# Patient Record
Sex: Male | Born: 1940 | Race: White | Hispanic: No | Marital: Married | Smoking: Never smoker
Health system: Southern US, Community
[De-identification: ages and names within clinical notes are randomized; demographics above are authoritative.]

## PROBLEM LIST (undated history)

## (undated) DIAGNOSIS — R1310 Dysphagia, unspecified: Secondary | ICD-10-CM

## (undated) DIAGNOSIS — C61 Malignant neoplasm of prostate: Secondary | ICD-10-CM

## (undated) DIAGNOSIS — H02409 Unspecified ptosis of unspecified eyelid: Secondary | ICD-10-CM

## (undated) DIAGNOSIS — N529 Male erectile dysfunction, unspecified: Secondary | ICD-10-CM

## (undated) DIAGNOSIS — H269 Unspecified cataract: Secondary | ICD-10-CM

## (undated) DIAGNOSIS — F32A Depression, unspecified: Secondary | ICD-10-CM

## (undated) DIAGNOSIS — K649 Unspecified hemorrhoids: Secondary | ICD-10-CM

## (undated) DIAGNOSIS — I2510 Atherosclerotic heart disease of native coronary artery without angina pectoris: Secondary | ICD-10-CM

## (undated) DIAGNOSIS — R32 Unspecified urinary incontinence: Secondary | ICD-10-CM

## (undated) DIAGNOSIS — I10 Essential (primary) hypertension: Secondary | ICD-10-CM

## (undated) DIAGNOSIS — F988 Other specified behavioral and emotional disorders with onset usually occurring in childhood and adolescence: Secondary | ICD-10-CM

## (undated) DIAGNOSIS — I639 Cerebral infarction, unspecified: Secondary | ICD-10-CM

## (undated) DIAGNOSIS — IMO0002 Reserved for concepts with insufficient information to code with codable children: Secondary | ICD-10-CM

## (undated) DIAGNOSIS — K219 Gastro-esophageal reflux disease without esophagitis: Secondary | ICD-10-CM

## (undated) DIAGNOSIS — M25473 Effusion, unspecified ankle: Secondary | ICD-10-CM

## (undated) DIAGNOSIS — F419 Anxiety disorder, unspecified: Secondary | ICD-10-CM

## (undated) DIAGNOSIS — N189 Chronic kidney disease, unspecified: Secondary | ICD-10-CM

## (undated) DIAGNOSIS — T7840XA Allergy, unspecified, initial encounter: Secondary | ICD-10-CM

## (undated) DIAGNOSIS — M1990 Unspecified osteoarthritis, unspecified site: Secondary | ICD-10-CM

## (undated) DIAGNOSIS — K5790 Diverticulosis of intestine, part unspecified, without perforation or abscess without bleeding: Secondary | ICD-10-CM

## (undated) DIAGNOSIS — Z87898 Personal history of other specified conditions: Secondary | ICD-10-CM

## (undated) DIAGNOSIS — D175 Benign lipomatous neoplasm of intra-abdominal organs: Secondary | ICD-10-CM

## (undated) DIAGNOSIS — E785 Hyperlipidemia, unspecified: Secondary | ICD-10-CM

## (undated) HISTORY — DX: Other specified behavioral and emotional disorders with onset usually occurring in childhood and adolescence: F98.8

## (undated) HISTORY — DX: Unspecified osteoarthritis, unspecified site: M19.90

## (undated) HISTORY — DX: Dysphagia, unspecified: R13.10

## (undated) HISTORY — DX: Benign lipomatous neoplasm of intra-abdominal organs: D17.5

## (undated) HISTORY — DX: Allergy, unspecified, initial encounter: T78.40XA

## (undated) HISTORY — PX: EYE SURGERY: SHX253

## (undated) HISTORY — PX: PROSTATE SURGERY: SHX751

## (undated) HISTORY — DX: Diverticulosis of intestine, part unspecified, without perforation or abscess without bleeding: K57.90

## (undated) HISTORY — DX: Malignant neoplasm of prostate: C61

## (undated) HISTORY — DX: Unspecified cataract: H26.9

## (undated) HISTORY — PX: COLONOSCOPY: SHX174

## (undated) HISTORY — DX: Unspecified hemorrhoids: K64.9

## (undated) HISTORY — PX: OTHER SURGICAL HISTORY: SHX169

## (undated) HISTORY — DX: Reserved for concepts with insufficient information to code with codable children: IMO0002

## (undated) HISTORY — PX: MENISCUS REPAIR: SHX5179

## (undated) HISTORY — DX: Cerebral infarction, unspecified: I63.9

## (undated) HISTORY — DX: Essential (primary) hypertension: I10

## (undated) HISTORY — DX: Hyperlipidemia, unspecified: E78.5

---

## 1998-04-22 ENCOUNTER — Inpatient Hospital Stay (HOSPITAL_COMMUNITY): Admission: EM | Admit: 1998-04-22 | Discharge: 1998-04-23 | Payer: Self-pay | Admitting: Emergency Medicine

## 1998-04-22 ENCOUNTER — Encounter: Payer: Self-pay | Admitting: Emergency Medicine

## 1998-04-23 ENCOUNTER — Encounter: Payer: Self-pay | Admitting: Emergency Medicine

## 1998-06-27 HISTORY — PX: OTHER SURGICAL HISTORY: SHX169

## 2000-05-26 ENCOUNTER — Other Ambulatory Visit: Admission: RE | Admit: 2000-05-26 | Discharge: 2000-05-26 | Payer: Self-pay | Admitting: Gastroenterology

## 2000-05-26 ENCOUNTER — Encounter (INDEPENDENT_AMBULATORY_CARE_PROVIDER_SITE_OTHER): Payer: Self-pay | Admitting: Specialist

## 2000-11-21 ENCOUNTER — Encounter (INDEPENDENT_AMBULATORY_CARE_PROVIDER_SITE_OTHER): Payer: Self-pay | Admitting: *Deleted

## 2000-11-21 ENCOUNTER — Ambulatory Visit (HOSPITAL_BASED_OUTPATIENT_CLINIC_OR_DEPARTMENT_OTHER): Admission: RE | Admit: 2000-11-21 | Discharge: 2000-11-21 | Payer: Self-pay | Admitting: Plastic Surgery

## 2001-11-01 ENCOUNTER — Encounter (INDEPENDENT_AMBULATORY_CARE_PROVIDER_SITE_OTHER): Payer: Self-pay | Admitting: Specialist

## 2001-11-01 ENCOUNTER — Ambulatory Visit (HOSPITAL_BASED_OUTPATIENT_CLINIC_OR_DEPARTMENT_OTHER): Admission: RE | Admit: 2001-11-01 | Discharge: 2001-11-01 | Payer: Self-pay | Admitting: Orthopedic Surgery

## 2004-06-02 ENCOUNTER — Ambulatory Visit: Payer: Self-pay | Admitting: Internal Medicine

## 2004-07-13 ENCOUNTER — Ambulatory Visit: Payer: Self-pay | Admitting: Internal Medicine

## 2004-07-23 ENCOUNTER — Ambulatory Visit: Payer: Self-pay | Admitting: Internal Medicine

## 2004-08-03 ENCOUNTER — Ambulatory Visit: Payer: Self-pay

## 2005-01-13 ENCOUNTER — Ambulatory Visit: Payer: Self-pay | Admitting: Internal Medicine

## 2005-02-07 ENCOUNTER — Ambulatory Visit: Payer: Self-pay | Admitting: Internal Medicine

## 2005-04-18 ENCOUNTER — Ambulatory Visit: Payer: Self-pay | Admitting: Internal Medicine

## 2005-05-11 ENCOUNTER — Ambulatory Visit: Payer: Self-pay | Admitting: Internal Medicine

## 2006-04-10 ENCOUNTER — Ambulatory Visit: Payer: Self-pay | Admitting: Internal Medicine

## 2006-05-03 ENCOUNTER — Ambulatory Visit: Payer: Self-pay | Admitting: Gastroenterology

## 2006-05-05 ENCOUNTER — Ambulatory Visit: Payer: Self-pay | Admitting: Internal Medicine

## 2006-05-05 LAB — CONVERTED CEMR LAB
AST: 21 units/L (ref 0–37)
Total CK: 152 units/L (ref 7–195)

## 2006-06-15 ENCOUNTER — Ambulatory Visit: Payer: Self-pay | Admitting: Internal Medicine

## 2006-06-15 LAB — CONVERTED CEMR LAB
AST: 27 units/L (ref 0–37)
Chol/HDL Ratio, serum: 5
Cholesterol: 222 mg/dL (ref 0–200)
LDL DIRECT: 146.5 mg/dL
Triglyceride fasting, serum: 147 mg/dL (ref 0–149)

## 2006-07-04 ENCOUNTER — Encounter (INDEPENDENT_AMBULATORY_CARE_PROVIDER_SITE_OTHER): Payer: Self-pay | Admitting: Specialist

## 2006-07-04 ENCOUNTER — Ambulatory Visit: Payer: Self-pay | Admitting: Gastroenterology

## 2006-07-21 DIAGNOSIS — Z860100 Personal history of colon polyps, unspecified: Secondary | ICD-10-CM | POA: Insufficient documentation

## 2006-07-31 ENCOUNTER — Ambulatory Visit: Payer: Self-pay | Admitting: Internal Medicine

## 2006-10-06 ENCOUNTER — Ambulatory Visit: Payer: Self-pay | Admitting: Family Medicine

## 2007-04-20 ENCOUNTER — Telehealth (INDEPENDENT_AMBULATORY_CARE_PROVIDER_SITE_OTHER): Payer: Self-pay | Admitting: *Deleted

## 2007-05-18 ENCOUNTER — Telehealth (INDEPENDENT_AMBULATORY_CARE_PROVIDER_SITE_OTHER): Payer: Self-pay | Admitting: *Deleted

## 2007-05-21 ENCOUNTER — Telehealth (INDEPENDENT_AMBULATORY_CARE_PROVIDER_SITE_OTHER): Payer: Self-pay | Admitting: *Deleted

## 2007-05-31 ENCOUNTER — Ambulatory Visit: Payer: Self-pay | Admitting: Internal Medicine

## 2007-05-31 DIAGNOSIS — T887XXA Unspecified adverse effect of drug or medicament, initial encounter: Secondary | ICD-10-CM | POA: Insufficient documentation

## 2007-05-31 DIAGNOSIS — E782 Mixed hyperlipidemia: Secondary | ICD-10-CM

## 2007-05-31 DIAGNOSIS — N400 Enlarged prostate without lower urinary tract symptoms: Secondary | ICD-10-CM | POA: Insufficient documentation

## 2007-05-31 DIAGNOSIS — E8881 Metabolic syndrome: Secondary | ICD-10-CM

## 2007-05-31 LAB — CONVERTED CEMR LAB
HDL goal, serum: 40 mg/dL
LDL Goal: 100 mg/dL

## 2007-06-03 ENCOUNTER — Encounter: Payer: Self-pay | Admitting: Internal Medicine

## 2007-06-03 LAB — CONVERTED CEMR LAB
ALT: 35 units/L (ref 0–53)
AST: 28 units/L (ref 0–37)
Alkaline Phosphatase: 66 units/L (ref 39–117)
Cholesterol: 165 mg/dL (ref 0–200)
Creatinine, Ser: 1.1 mg/dL (ref 0.4–1.5)
Folate: 17.1 ng/mL
PSA: 5.52 ng/mL — ABNORMAL HIGH (ref 0.10–4.00)
Potassium: 4.7 meq/L (ref 3.5–5.1)
TSH: 0.88 microintl units/mL (ref 0.35–5.50)
Triglycerides: 50 mg/dL (ref 0–149)
Vitamin B-12: 236 pg/mL (ref 211–911)

## 2007-06-04 ENCOUNTER — Encounter (INDEPENDENT_AMBULATORY_CARE_PROVIDER_SITE_OTHER): Payer: Self-pay | Admitting: *Deleted

## 2007-06-25 ENCOUNTER — Encounter: Payer: Self-pay | Admitting: Internal Medicine

## 2007-06-28 DIAGNOSIS — C61 Malignant neoplasm of prostate: Secondary | ICD-10-CM

## 2007-06-28 HISTORY — DX: Malignant neoplasm of prostate: C61

## 2007-08-20 ENCOUNTER — Ambulatory Visit: Payer: Self-pay | Admitting: Internal Medicine

## 2007-11-15 ENCOUNTER — Encounter: Payer: Self-pay | Admitting: Internal Medicine

## 2007-12-01 ENCOUNTER — Ambulatory Visit: Admission: EM | Admit: 2007-12-01 | Discharge: 2007-12-01 | Payer: Self-pay | Admitting: Orthopedic Surgery

## 2008-01-25 ENCOUNTER — Encounter: Payer: Self-pay | Admitting: Internal Medicine

## 2008-02-22 ENCOUNTER — Encounter: Payer: Self-pay | Admitting: Internal Medicine

## 2008-06-23 ENCOUNTER — Encounter: Admission: RE | Admit: 2008-06-23 | Discharge: 2008-06-23 | Payer: Self-pay

## 2008-06-27 HISTORY — PX: EYE SURGERY: SHX253

## 2008-07-29 ENCOUNTER — Telehealth (INDEPENDENT_AMBULATORY_CARE_PROVIDER_SITE_OTHER): Payer: Self-pay | Admitting: *Deleted

## 2008-08-27 ENCOUNTER — Encounter: Payer: Self-pay | Admitting: Internal Medicine

## 2008-09-12 ENCOUNTER — Telehealth (INDEPENDENT_AMBULATORY_CARE_PROVIDER_SITE_OTHER): Payer: Self-pay | Admitting: *Deleted

## 2008-09-16 ENCOUNTER — Ambulatory Visit: Payer: Self-pay | Admitting: Internal Medicine

## 2008-09-16 DIAGNOSIS — Z85828 Personal history of other malignant neoplasm of skin: Secondary | ICD-10-CM

## 2008-09-16 DIAGNOSIS — K219 Gastro-esophageal reflux disease without esophagitis: Secondary | ICD-10-CM | POA: Insufficient documentation

## 2008-09-16 DIAGNOSIS — Z8546 Personal history of malignant neoplasm of prostate: Secondary | ICD-10-CM

## 2008-09-16 DIAGNOSIS — K5730 Diverticulosis of large intestine without perforation or abscess without bleeding: Secondary | ICD-10-CM | POA: Insufficient documentation

## 2008-09-16 LAB — CONVERTED CEMR LAB
Cholesterol, target level: 200 mg/dL
LDL Goal: 100 mg/dL

## 2008-09-17 ENCOUNTER — Encounter: Payer: Self-pay | Admitting: Internal Medicine

## 2008-10-22 ENCOUNTER — Ambulatory Visit: Payer: Self-pay | Admitting: Internal Medicine

## 2008-10-25 LAB — CONVERTED CEMR LAB
ALT: 23 units/L (ref 0–53)
AST: 23 units/L (ref 0–37)
Alkaline Phosphatase: 74 units/L (ref 39–117)
Bilirubin, Direct: 0 mg/dL (ref 0.0–0.3)
Direct LDL: 129.2 mg/dL
HDL: 44 mg/dL (ref >39.00)
Hgb A1c MFr Bld: 5.4 % (ref 4.6–6.5)
TSH: 1.03 microintl units/mL (ref 0.35–5.50)
Total Protein: 7.3 g/dL (ref 6.0–8.3)

## 2008-10-28 ENCOUNTER — Encounter (INDEPENDENT_AMBULATORY_CARE_PROVIDER_SITE_OTHER): Payer: Self-pay | Admitting: *Deleted

## 2009-02-11 ENCOUNTER — Ambulatory Visit: Payer: Self-pay | Admitting: Internal Medicine

## 2009-02-11 DIAGNOSIS — M25559 Pain in unspecified hip: Secondary | ICD-10-CM | POA: Insufficient documentation

## 2009-02-11 DIAGNOSIS — M25569 Pain in unspecified knee: Secondary | ICD-10-CM | POA: Insufficient documentation

## 2009-02-14 LAB — CONVERTED CEMR LAB
Creatinine, Ser: 1 mg/dL (ref 0.4–1.5)
Potassium: 4 meq/L (ref 3.5–5.1)

## 2009-02-16 ENCOUNTER — Encounter (INDEPENDENT_AMBULATORY_CARE_PROVIDER_SITE_OTHER): Payer: Self-pay | Admitting: *Deleted

## 2009-02-18 ENCOUNTER — Telehealth (INDEPENDENT_AMBULATORY_CARE_PROVIDER_SITE_OTHER): Payer: Self-pay | Admitting: *Deleted

## 2009-03-04 ENCOUNTER — Encounter: Payer: Self-pay | Admitting: Internal Medicine

## 2009-03-23 ENCOUNTER — Telehealth (INDEPENDENT_AMBULATORY_CARE_PROVIDER_SITE_OTHER): Payer: Self-pay | Admitting: *Deleted

## 2009-05-04 ENCOUNTER — Telehealth (INDEPENDENT_AMBULATORY_CARE_PROVIDER_SITE_OTHER): Payer: Self-pay | Admitting: *Deleted

## 2009-08-10 ENCOUNTER — Telehealth (INDEPENDENT_AMBULATORY_CARE_PROVIDER_SITE_OTHER): Payer: Self-pay | Admitting: *Deleted

## 2009-10-02 ENCOUNTER — Telehealth (INDEPENDENT_AMBULATORY_CARE_PROVIDER_SITE_OTHER): Payer: Self-pay | Admitting: *Deleted

## 2009-10-12 ENCOUNTER — Telehealth (INDEPENDENT_AMBULATORY_CARE_PROVIDER_SITE_OTHER): Payer: Self-pay | Admitting: *Deleted

## 2009-12-04 ENCOUNTER — Ambulatory Visit: Payer: Self-pay | Admitting: Internal Medicine

## 2009-12-07 LAB — CONVERTED CEMR LAB: Potassium: 4.3 meq/L (ref 3.5–5.1)

## 2010-01-01 ENCOUNTER — Ambulatory Visit: Payer: Self-pay | Admitting: Internal Medicine

## 2010-01-01 DIAGNOSIS — R0789 Other chest pain: Secondary | ICD-10-CM | POA: Insufficient documentation

## 2010-01-01 DIAGNOSIS — R5381 Other malaise: Secondary | ICD-10-CM

## 2010-01-01 DIAGNOSIS — R5383 Other fatigue: Secondary | ICD-10-CM

## 2010-01-04 ENCOUNTER — Ambulatory Visit: Payer: Self-pay | Admitting: Internal Medicine

## 2010-02-12 ENCOUNTER — Telehealth: Payer: Self-pay | Admitting: Internal Medicine

## 2010-02-23 ENCOUNTER — Ambulatory Visit: Payer: Self-pay | Admitting: Internal Medicine

## 2010-05-17 ENCOUNTER — Telehealth (INDEPENDENT_AMBULATORY_CARE_PROVIDER_SITE_OTHER): Payer: Self-pay | Admitting: *Deleted

## 2010-07-25 LAB — CONVERTED CEMR LAB
ALT: 29 units/L (ref 0–53)
BUN: 19 mg/dL (ref 6–23)
Basophils Relative: 0.5 % (ref 0.0–3.0)
CO2: 29 meq/L (ref 19–32)
Chloride: 105 meq/L (ref 96–112)
Eosinophils Relative: 1 % (ref 0.0–5.0)
HCT: 49.7 % (ref 39.0–52.0)
Lymphs Abs: 2.7 10*3/uL (ref 0.7–4.0)
MCHC: 35 g/dL (ref 30.0–36.0)
MCV: 95 fL (ref 78.0–100.0)
Monocytes Absolute: 0.9 10*3/uL (ref 0.1–1.0)
Platelets: 252 10*3/uL (ref 150.0–400.0)
Potassium: 4.8 meq/L (ref 3.5–5.1)
TSH: 0.96 microintl units/mL (ref 0.35–5.50)
Total Bilirubin: 0.8 mg/dL (ref 0.3–1.2)
Total Protein: 7.7 g/dL (ref 6.0–8.3)
WBC: 8.6 10*3/uL (ref 4.5–10.5)

## 2010-07-29 NOTE — Assessment & Plan Note (Signed)
Summary: ELEVATED BLOOD PRESS///SPH/rescd cbs   Vital Signs:  Patient profile:   70 year old male Weight:      222.0 pounds BMI:     33.38 Temp:     98.2 degrees F oral Pulse rate:   72 / minute Resp:     14 per minute BP sitting:   138 / 84  (left arm) Cuff size:   large  Vitals Entered By: Shonna Chock (December 04, 2009 9:30 AM) CC: Blood Pressure concerns, Hypertension Management Comments REVIEWED MED LIST, PATIENT AGREED DOSE AND INSTRUCTION CORRECT    CC:  Blood Pressure concerns and Hypertension Management.  History of Present Illness: BP ranges 128/69- 165/100 ; some stress triggers for HTN. HCTZ not taken; he is not sure why.  Hypertension History:      He complains of visual symptoms, but denies headache, chest pain, palpitations, dyspnea with exertion, orthopnea, PND, peripheral edema, neurologic problems, syncope, and side effects from treatment.        Positive major cardiovascular risk factors include male age 84 years old or older, hyperlipidemia, and hypertension.  Negative major cardiovascular risk factors include no history of diabetes, negative family history for ischemic heart disease, and non-tobacco-user status.        Further assessment for target organ damage reveals no history of ASHD, stroke/TIA, or peripheral vascular disease.     Allergies (verified): No Known Drug Allergies  Review of Systems Eyes:  Complains of blurring; denies double vision and vision loss-both eyes. CV:  Denies leg cramps with exertion, lightheadness, and near fainting. Neuro:  Denies brief paralysis, numbness, tingling, and weakness.  Physical Exam  General:  in no acute distress; alert,appropriate and cooperative throughout examination Lungs:  Normal respiratory effort, chest expands symmetrically. Lungs are clear to auscultation, no crackles or wheezes. Heart:  Normal rate and regular rhythm. S1 and S2 normal without gallop, murmur, click, rub . S4 with slurring Abdomen:   Bowel sounds positive,abdomen soft and non-tender without masses, organomegaly or hernias noted. No AAA or bruits Pulses:  R and L carotid,radial,dorsalis pedis and posterior tibial pulses are full and equal bilaterally Extremities:  No clubbing, cyanosis, edema. Psych:  memory intact for recent and remote and subdued.     Impression & Recommendations:  Problem # 1:  HYPERTENSION, ESSENTIAL NOS (ICD-401.9)  The following medications were removed from the medication list:    Hydrochlorothiazide 25 Mg Tabs (Hydrochlorothiazide) .Marland Kitchen... 1/2 tab qd His updated medication list for this problem includes:    Benazepril Hcl 40 Mg Tabs (Benazepril hcl) .Marland Kitchen... 1 by mouth qd    Amlodipine Besylate 5 Mg Tabs (Amlodipine besylate) .Marland Kitchen... 1 once daily    Hydrochlorothiazide 12.5 Mg Caps (Hydrochlorothiazide) .Marland Kitchen... 1 once daily  Orders: TLB-BUN (Urea Nitrogen) (84520-BUN)  Complete Medication List: 1)  Viagra 100 Mg Tabs (Sildenafil citrate) .... As needed 2)  Benazepril Hcl 40 Mg Tabs (Benazepril hcl) .Marland Kitchen.. 1 by mouth qd 3)  Amlodipine Besylate 5 Mg Tabs (Amlodipine besylate) .Marland Kitchen.. 1 once daily 4)  Hydrochlorothiazide 12.5 Mg Caps (Hydrochlorothiazide) .Marland Kitchen.. 1 once daily  Other Orders: Prescription Created Electronically [id number]) Venipuncture (60454) TLB-Creatinine, Blood (82565-CREA) TLB-Potassium (K+) (84132-K)  Hypertension Assessment/Plan:      The patient's hypertensive risk group is category B: At least one risk factor (excluding diabetes) with no target organ damage.  His calculated 10 year risk of coronary heart disease is 11 %.  Today's blood pressure is 138/84.    Patient Instructions: 1)  Limit your Sodium (  Salt) to less than 4 grams a day (slightly less than 1 teaspoon) to prevent fluid retention, swelling, or worsening or symptoms. Annual [facility] [place] exam indicated. 2)  Check your Blood Pressure regularly. If it is above: 140/90 ON AVERAGE  you should make an appointment.( Note:  [phone]) Prescriptions: HYDROCHLOROTHIAZIDE 12.5 MG CAPS (HYDROCHLOROTHIAZIDE) 1 once daily  #90 x 1   Entered and Authorized by:   Marga Melnick MD   Signed by:   Marga Melnick MD on 12/04/2009   Method used:   Faxed to ...       [facility] (retail)       [address]       [place], [place]  [address]       Ph: [phone]       Fax: [phone]   RxID:   [id number] BENAZEPRIL HCL 40 MG  TABS (BENAZEPRIL HCL) 1 by mouth qd  #90 x 1   Entered and Authorized by:   Marga Melnick MD   Signed by:   Marga Melnick MD on 12/04/2009   Method used:   Faxed to ...       [facility] (retail)       [address]       [place], [place]  [address]       Ph: [phone]       Fax: [phone]   RxID:   [id number] AMLODIPINE BESYLATE 5 MG  TABS (AMLODIPINE BESYLATE) 1 once daily  #90 x 1   Entered and Authorized by:   Marga Melnick MD   Signed by:   Marga Melnick MD on 12/04/2009   Method used:   Faxed to ...       [facility] (retail)       [address]       [place], [place]  [address]       Ph: [phone]       Fax: [phone]   RxID:   [id number]

## 2010-07-29 NOTE — Assessment & Plan Note (Signed)
Summary: LIGHT-HEADED FOR FEW DAYS////SPH   Vital Signs:  Patient profile:   70 year old male Weight:      221.6 pounds Temp:     98.4 degrees F oral Pulse rate:   63 / minute BP sitting:   122 / 80  (left arm) Cuff size:   large  Vitals Entered By: Shonna Chock (January 01, 2010 12:26 PM) CC: Light-Headed and pressure in chest, Syncope Comments REVIEWED MED LIST, PATIENT AGREED DOSE AND INSTRUCTION CORRECT    CC:  Light-Headed and pressure in chest and Syncope.  History of Present Illness:        This is a 70 year old man who presents with lightheadedness intermittently for 2 + weeks, especially in afternoon.  The patient reports occasional vague  chest discomfort ,worse with deep breathing.He denies loss of consciousness, premonitory symptoms,specific triggers such as activity, meal intake  or position change.He also denies  near loss of consciousness, palpitations, shortness of breath, and incontinence beyond that related to prostate issues.  Associated symptoms include feeling warm or flushed occasionally.  The patient denies the following symptoms: headache, abdominal discomfort, nausea, vomiting, pallor, diaphoresis, focal weakness, blurred vision, or  perioral numbness.  The patient reports the following precipitating factors: emotional distress.  Risk factors for syncope include antihypertensive medications.    Allergies (verified): No Known Drug Allergies  Review of Systems General:  Complains of sweats; denies chills and fever; Mild fatigue . Eyes:  Denies double vision; Occasioanl blurred vision OD. ENT:  Denies decreased hearing, difficulty swallowing, and hoarseness. Resp:  Denies cough, coughing up blood, excessive snoring, hypersomnolence, morning headaches, and sputum productive; No apnea. Derm:  Denies changes in nail beds, dryness, and hair loss. Neuro:  Complains of difficulty with concentration; denies numbness, tingling, and weakness. Psych:  Denies anxiety; Some  depression. Endo:  Denies cold intolerance and heat intolerance.  Physical Exam  General:  well-nourished,in no acute distress; alert,appropriate and cooperative throughout examination Eyes:  No corneal or conjunctival inflammation noted. EOMI. Perrla. Field of  Vision grossly normal. Neck:  No deformities, masses, or tenderness noted. Lungs:  Normal respiratory effort, chest expands symmetrically. Lungs are clear to auscultation, no crackles or wheezes. Heart:  regular rhythm, no murmur, no gallop, no rub, no JVD, and bradycardia.  S4 with slurring Pulses:  R and L carotid pulses are full and equal bilaterally w/o bruits Extremities:  No clubbing, cyanosis, edema. Neurologic:   oriented X3, cranial nerves II-XII intact, strength normal in all extremities, sensation intact to light touch, gait normal, DTRs symmetrical and normal, finger-to-nose normal, and Romberg negative.  No pronator drift Skin:  Intact without suspicious lesions or rashes Cervical Nodes:  No lymphadenopathy noted Psych:  memory intact for recent and remote, flat affect, and subdued.     Impression & Recommendations:  Problem # 1:  DIZZINESS (ICD-780.4)  Orders: Venipuncture ([address]) TLB-BMP (Basic Metabolic Panel-BMET) (80048-METABOL) TLB-CBC Platelet - w/Differential (85025-CBCD) TLB-Hepatic/Liver Function Pnl (80076-HEPATIC) TLB-TSH (Thyroid Stimulating Hormone) (84443-TSH) EKG w/ Interpretation (93000)  Problem # 2:  FATIGUE (ICD-780.79)  he questions some depression , but declines meds  Orders: Venipuncture (60[address]) TLB-BMP (Basic Metabolic Panel-BMET) (80048-METABOL) TLB-CBC Platelet - w/Differential (85025-CBCD) TLB-Hepatic/Liver Function Pnl (80076-HEPATIC) TLB-TSH (Thyroid Stimulating Hormone) (84443-TSH)  Complete Medication List: 1)  Viagra 100 Mg Tabs (Sildenafil citrate) .... As needed 2)  Benazepril Hcl 40 Mg Tabs (Benazepril hcl) .Marland Kitchen.. 1 by mouth qd 3)  Amlodipine Besylate 5 Mg Tabs  (Amlodipine besylate) .Marland Kitchen.. 1 once  daily 4)  Hydrochlorothiazide 12.5 Mg Caps (Hydrochlorothiazide) .Marland Kitchen.. 1 once daily  Patient Instructions: 1)  Keep Diary of possible triggers such as high carb intake @ meals. Hold HCTZ temporarily & assess symptom response. Consider meds if depression is significant

## 2010-07-29 NOTE — Progress Notes (Signed)
Summary: Refill Request  Phone Note From Pharmacy Call back at [phone]   Caller: [facility] Summary of Call: Message left on VM: Need Refill for Amlodipine, patient at pharmacy now   I called the pharmacy and ok'd # 90 with no refills .Shonna Chock  October 12, 2009 12:35 PM

## 2010-07-29 NOTE — Progress Notes (Signed)
Summary: refill  Phone Note Refill Request Message from:  Fax from Pharmacy on February 12, 2010 8:30 AM  Refills Requested: Medication #1:  AMLODIPINE BESYLATE 5 MG  TABS 1 once daily  Medication #2:  BENAZEPRIL HCL 40 MG  TABS 1 by mouth qd  Medication #3:  HYDROCHLOROTHIAZIDE 12.5 MG CAPS 1 once daily. [facility] - fax [phone]   Initial call taken by: Okey Regal Spring,  February 12, 2010 8:33 AM  Follow-up for Phone Call        These were refilled 12-04-09 for #90. Pharmacy notified. Lucious Groves CMA  February 12, 2010 2:21 PM

## 2010-07-29 NOTE — Progress Notes (Signed)
Summary: Refill Request  Phone Note Refill Request Message from:  Pharmacy on [facility] Fax # [phone]  Refills Requested: Medication #1:  BENAZEPRIL HCL 40 MG  TABS 1 by mouth qd   Dosage confirmed as above?Dosage Confirmed Initial call taken by: Harold Barban,  August 10, 2009 11:18 AM  Follow-up for Phone Call        Note made on RX that med was filled 04/2009 # 90 with 1 refill. Additional refills not needed til 10/2009 but if not on file to fill # 90 with no refills at this time Follow-up by: Shonna Chock,  August 10, 2009 11:23 AM    Prescriptions: BENAZEPRIL HCL 40 MG  TABS (BENAZEPRIL HCL) 1 by mouth qd  #90 x 0   Entered by:   Shonna Chock   Authorized by:   Marga Melnick MD   Signed by:   Shonna Chock on 08/10/2009   Method used:   Printed then faxed to ...       [facility] (retail)       [address]       [place], [place]  [address]       Ph: [phone]       Fax: [phone]   RxID:   [id number]

## 2010-07-29 NOTE — Assessment & Plan Note (Signed)
Summary: BP TRYING TO GET THEM FILL//PH   Vital Signs:  Patient profile:   70 year old male Height:      69.25 inches Weight:      222 pounds BMI:     32.67 Temp:     98.3 degrees F oral Pulse rate:   60 / minute Resp:     14 per minute BP sitting:   124 / 80  (left arm) Cuff size:   large  Vitals Entered By: Shonna Chock CMA (February 23, 2010 3:41 PM) CC: Patient states he is here per Dr.Hopper's request but he is with no concerns   CC:  Patient states he is here per Dr.Hopper's request but he is with no concerns.  History of Present Illness: Hypertension Follow-Up      This is a 70 year old man who presents for Hypertension follow-up.  The patient denies lightheadedness, urinary frequency, headaches, rash, and fatigue.  Associated symptoms include dyspnea.  The patient denies the following associated symptoms: chest pain, chest pressure, exercise intolerance, palpitations, syncope, leg edema, and pedal edema.  Compliance with medications (by patient report) has been near 100%.  The patient reports that dietary compliance has been fair.  The patient reports exercising daily.  Adjunctive measures currently used by the patient include salt restriction.   BP @ home 130-140/70-80. Renal function was normal in 11/2009.  Current Medications (verified): 1)  Viagra 100 Mg Tabs (Sildenafil Citrate) .... As Needed 2)  Benazepril Hcl 40 Mg  Tabs (Benazepril Hcl) .Marland Kitchen.. 1 By Mouth Qd 3)  Amlodipine Besylate 5 Mg  Tabs (Amlodipine Besylate) .Marland Kitchen.. 1 Once Daily 4)  Hydrochlorothiazide 12.5 Mg Caps (Hydrochlorothiazide) .Marland Kitchen.. 1 Once Daily 5)  Omega-3 1000 Mg Caps (Omega-3 Fatty Acids) .Marland Kitchen.. 1 By Mouth Once Daily 6)  Multivitamins  Tabs (Multiple Vitamin) .Marland Kitchen.. 1 By Mouth Once Daily 7)  Prilosec Otc 20 Mg Tbec (Omeprazole Magnesium) .Marland Kitchen.. 1 By Mouth Once Daily 8)  Garlic Oil 500 Mg Tabs (Garlic) .Marland Kitchen.. 1 By Mouth Once Daily 9)  Vitamin D 1000 Unit Tabs (Cholecalciferol) .Marland Kitchen.. 1 By Mouth Once Daily 10)   Vision Vitamins  Tabs (Multiple Vitamins-Minerals) .Marland Kitchen.. 1 By Mouth Once Daily 11)  Aspirin 81 Mg Tabs (Aspirin) .Marland Kitchen.. 1 By Mouth Once Daily 12)  B Complex  Tabs (B Complex Vitamins) .Marland Kitchen.. 1 By Mouth Once Daily 13)  Fiber 625 Mg Tabs (Calcium Polycarbophil) .Marland Kitchen.. 1 By Mouth Once Daily 14)  Cranberry 500 Mg Caps (Cranberry) .Marland Kitchen.. 1 By Mouth Once Daily  Allergies (verified): No Known Drug Allergies  Physical Exam  General:  well-nourished; alert,appropriate and cooperative throughout examination Lungs:  Normal respiratory effort, chest expands symmetrically. Lungs are clear to auscultation, no crackles or wheezes. Heart:  Normal rate and regular rhythm. S1 and S2 normal without gallop, murmur, click, rub .S4 Abdomen:  Bowel sounds positive,abdomen soft and non-tender without masses, organomegaly or hernias noted. No AAA or bruits  Pulses:  R and L carotid,radial,dorsalis pedis and posterior tibial pulses are full and equal bilaterally Extremities:  trace left pedal edema and trace right pedal edema.     Impression & Recommendations:  Problem # 1:  HYPERTENSION, ESSENTIAL (ICD-401.9) controlled  His updated medication list for this problem includes:    Benazepril Hcl 40 Mg Tabs (Benazepril hcl) .Marland Kitchen... 1 by mouth qd    Amlodipine Besylate 5 Mg Tabs (Amlodipine besylate) .Marland Kitchen... 1 once daily    Hydrochlorothiazide 12.5 Mg Caps (Hydrochlorothiazide) .Marland Kitchen... 1 once daily  Complete  Medication List: 1)  Benazepril Hcl 40 Mg Tabs (Benazepril hcl) .Marland Kitchen.. 1 by mouth qd 2)  Amlodipine Besylate 5 Mg Tabs (Amlodipine besylate) .Marland Kitchen.. 1 once daily 3)  Hydrochlorothiazide 12.5 Mg Caps (Hydrochlorothiazide) .Marland Kitchen.. 1 once daily 4)  Omega-3 1000 Mg Caps (Omega-3 fatty acids) .Marland Kitchen.. 1 by mouth once daily 5)  Multivitamins Tabs (Multiple vitamin) .Marland Kitchen.. 1 by mouth once daily 6)  Prilosec Otc 20 Mg Tbec (Omeprazole magnesium) .Marland Kitchen.. 1 by mouth once daily 7)  Garlic Oil 500 Mg Tabs (Garlic) .Marland Kitchen.. 1 by mouth once daily 8)   Vitamin D 1000 Unit Tabs (Cholecalciferol) .Marland Kitchen.. 1 by mouth once daily 9)  Vision Vitamins Tabs (Multiple vitamins-minerals) .Marland Kitchen.. 1 by mouth once daily 10)  Aspirin 81 Mg Tabs (Aspirin) .Marland Kitchen.. 1 by mouth once daily 11)  B Complex Tabs (B complex vitamins) .Marland Kitchen.. 1 by mouth once daily 12)  Fiber 625 Mg Tabs (Calcium polycarbophil) .Marland Kitchen.. 1 by mouth once daily 13)  Cranberry 500 Mg Caps (Cranberry) .Marland Kitchen.. 1 by mouth once daily 14)  Cialis 5 Mg Tabs (Tadalafil) .Marland Kitchen.. 1 once daily  Patient Instructions: 1)  Check your Blood Pressure regularly. If it is above: 135/85 ON AVERAGE  you should make an appointment. Prescriptions: CIALIS 5 MG TABS (TADALAFIL) 1 once daily  #30 x 11   Entered and Authorized by:   Marga Melnick MD   Signed by:   Marga Melnick MD on 02/23/2010   Method used:   Print then Give to Patient   RxID:   [id number] HYDROCHLOROTHIAZIDE 12.5 MG CAPS (HYDROCHLOROTHIAZIDE) 1 once daily  #90 x 3   Entered and Authorized by:   Marga Melnick MD   Signed by:   Marga Melnick MD on 02/23/2010   Method used:   Print then Give to Patient   RxID:   [id number] AMLODIPINE BESYLATE 5 MG  TABS (AMLODIPINE BESYLATE) 1 once daily  #90 x 3   Entered and Authorized by:   Marga Melnick MD   Signed by:   Marga Melnick MD on 02/23/2010   Method used:   Print then Give to Patient   RxID:   [id number] BENAZEPRIL HCL 40 MG  TABS (BENAZEPRIL HCL) 1 by mouth qd  #90 x 3   Entered and Authorized by:   Marga Melnick MD   Signed by:   Marga Melnick MD on 02/23/2010   Method used:   Print then Give to Patient   RxID:   [id number]

## 2010-07-29 NOTE — Progress Notes (Signed)
Summary: refill   Phone Note Refill Request Message from:  Fax from Pharmacy on May 17, 2010 11:52 AM  Refills Requested: Medication #1:  HYDROCHLOROTHIAZIDE 12.5 MG CAPS 1 once daily  Medication #2:  AMLODIPINE BESYLATE 5 MG  TABS 1 once daily  Medication #3:  BENAZEPRIL HCL 40 MG  TABS 1 by mouth qd [facility]- fax [phone]  Initial call taken by: Okey Regal Spring,  May 17, 2010 11:53 AM    Prescriptions: HYDROCHLOROTHIAZIDE 12.5 MG CAPS (HYDROCHLOROTHIAZIDE) 1 once daily  #90 x 2   Entered by:   Shonna Chock CMA   Authorized by:   Marga Melnick MD   Signed by:   Shonna Chock CMA on 05/17/2010   Method used:   Faxed to ...       [facility] (retail)       [address]       [place], [place]  [address]       Ph: [phone]       Fax: [phone]   RxID:   [id number] AMLODIPINE BESYLATE 5 MG  TABS (AMLODIPINE BESYLATE) 1 once daily  #90 x 2   Entered by:   Shonna Chock CMA   Authorized by:   Marga Melnick MD   Signed by:   Shonna Chock CMA on 05/17/2010   Method used:   Faxed to ...       [facility] (retail)       [address]       [place], [place]  [address]       Ph: [phone]       Fax: [phone]   RxID:   [id number] BENAZEPRIL HCL 40 MG  TABS (BENAZEPRIL HCL) 1 by mouth qd  #90 x 2   Entered by:   Shonna Chock CMA   Authorized by:   Marga Melnick MD   Signed by:   Shonna Chock CMA on 05/17/2010   Method used:   Faxed to ...       [facility] (retail)       [address]       [place], [place]  [address]       Ph: [phone]       Fax: [phone]   RxID:   [id number]

## 2010-07-29 NOTE — Progress Notes (Signed)
Summary: refill  Phone Note Refill Request Message from:  Fax from Pharmacy on October 02, 2009 9:26 AM  Refills Requested: Medication #1:  AMLODIPINE BESYLATE 5 MG  TABS 1 once daily if BP averages > 130/85 [facility] fax [phone]   Method Requested: Fax to Local Pharmacy Next Appointment Scheduled: no appt Initial call taken by: Barb Merino,  October 02, 2009 9:27 AM    Prescriptions: BENAZEPRIL HCL 40 MG  TABS (BENAZEPRIL HCL) 1 by mouth qd  #90 x 0   Entered by:   Shonna Chock   Authorized by:   Marga Melnick MD   Signed by:   Shonna Chock on 10/02/2009   Method used:   Faxed to ...       [facility] (retail)       [address]       [place], [place]  [address]       Ph: [phone]       Fax: [phone]   RxID:   [id number]

## 2010-07-29 NOTE — Miscellaneous (Signed)
Summary: Orders Update   Clinical Lists Changes  Problems: Added new problem of CHEST DISCOMFORT (MWN-027.25) Orders: Added new Test order of T-2 View CXR (71020TC) - Signed

## 2010-09-10 ENCOUNTER — Telehealth (INDEPENDENT_AMBULATORY_CARE_PROVIDER_SITE_OTHER): Payer: Self-pay | Admitting: *Deleted

## 2010-09-14 NOTE — Progress Notes (Signed)
Summary: refill different med  Phone Note Refill Request Message from:  Fax from Pharmacy on September 10, 2010 10:58 AM  Refills Requested: Medication #1:  CIALIS 5 MG TABS 1 once daily. [facility] - fax [phone] - ***note on fax mr Zahn would like to switch back to viagra 100mg  please***  Initial call taken by: Okey Regal Spring,  September 10, 2010 11:02 AM  Follow-up for Phone Call        Dr.Hopper advise on chnage of med request Follow-up by: Shonna Chock CMA,  September 10, 2010 12:00 PM  Additional Follow-up for Phone Call Additional follow up Details #1::        OK Viagra 100 mg #6 Additional Follow-up by: Marga Melnick MD,  September 10, 2010 1:27 PM    New/Updated Medications: VIAGRA 100 MG TABS (SILDENAFIL CITRATE) take as directed Prescriptions: VIAGRA 100 MG TABS (SILDENAFIL CITRATE) take as directed  #6 x 0   Entered by:   Doristine Devoid CMA   Authorized by:   Marga Melnick MD   Signed by:   Doristine Devoid CMA on 09/10/2010   Method used:   Electronically to        [facility] (retail)       [address]       [place], [place]  [address]       Ph: [phone]       Fax: [phone]   RxID:   [id number]

## 2010-10-05 ENCOUNTER — Other Ambulatory Visit: Payer: Self-pay

## 2010-10-05 MED ORDER — SILDENAFIL CITRATE 100 MG PO TABS: 100.0000 mg | ORAL_TABLET | Freq: Every day | ORAL | Status: DC | PRN

## 2010-11-11 ENCOUNTER — Other Ambulatory Visit: Payer: Self-pay | Admitting: *Deleted

## 2010-11-11 MED ORDER — HYDROCHLOROTHIAZIDE 12.5 MG PO TABS: 12.5000 mg | ORAL_TABLET | Freq: Every day | ORAL | Status: DC

## 2010-11-12 NOTE — Assessment & Plan Note (Signed)
[facility]                        [place] OFFICE NOTE   NAME:LOYETheotis, Gerdeman                     MRN:          [id number]  DATE:07/31/2006                            DOB:          14-Sep-1940    Cheryle Horsfall (Gene) was seen July 31, 2006, for follow-up of his  dyslipidemia and to discuss the use of Adderall.   He feels Adderall has been of marked benefit in reference to a working  diagnosis of ADD.  It has allowed him to focus at work and be quite  productive.  The diagnosis was first entertained in November 2002.  He  had been on Serzone for several years for depression.  This medicine was  weaned and discontinued because of the black box warning in reference to  liver disease.  He states he was diagnosed with having ADD as a child;  on Adderall there was dramatic improvement in his symptoms.   My concern about Adderall is with hypertension & dyslipidemia as  cardiovascular risks.  In 2003 he had blood pressures of 140/90 or  higher.  Lotensin was added in April of that year.  The dose was  gradually titrated and HCTZ added for blood pressure control.   He continued to have daytime somnolence despite the Adderall, and a  sleep study was suggested in August 2003 but never completed.  He denied  any definite sleep apnea, but a brother does have sleep disorder.  Additionally, his son is treated for ADD.   History also includes some erectile dysfunction, for which Viagra was  prescribed.   He did have an exercise treadmill study on August 03, 2004, which was  negative @ extremely high work load of 12.3 METs.  On the basis of this  negative study, the Adderall was continued.   The Adderall was increased to 15 mg daily but the cardiac risk factors  were discussed with him.   He has been found to have elevated LDLs with a 20% cardiovascular risk.  He has opted not to take medications because of fears about the statin  side effects.   Based on the NMR, his risk would be at least 20% and a  safe LDL would be 75; his most recent LDL was 147 in December 2007.   He states that he does use a treadmill 1-1/2 miles per day with no  cardiopulmonary symptoms.  Blood pressure was well-controlled at 120/80.   As stated, I could not continue to prescribe the Adderall as long as he  has unaddressed lipid elevations.  I did review literature concerning  Strattera as an alternative, but this also has the same cardiovascular  warnings.   I have recommended he get a second opinion concerning the Adderall  administration.  As he feels that the ADD is a major problem,  definitive testing  will  be pursued.     Titus Dubin. Alwyn Ren, MD,FACP,FCCP  Electronically Signed    [facility]  DD: 08/02/2006  DT: 08/02/2006  Job #: [id number]

## 2010-11-12 NOTE — Assessment & Plan Note (Signed)
[facility]                                 ON-CALL NOTE   MEADE, HOGELAND                            MRN:          [id number]  DATE:07/03/2006                            DOB:          07-12-1940    DATE OF INTERACTION:  July 03, 2006, at 8 p.m.   PHONE NUMBER:  [phone]   CALLER:  Okey Regal, the wife.  They have questions about the procedure.   OBJECTIVE:  Going to have a colonoscopy tomorrow.  Has a step one to be  done the night before and a step two 5 hours prior to the colonoscopy.  Colonoscopy time was changed, was supposed to be later in the day.  They  changed the date and the time and he is to have it early in the morning  tomorrow morning. I told them to go ahead and take step two tonight  before he goes to bed and warned them that he might be up having bowel  movements tonight.   PRIMARY CARE Eri Platten:  Marga Melnick, M.D.   HOME OFFICE:  Laren Boom, MD  Electronically Signed    RNS/MedQ  DD: 07/03/2006  DT: 07/04/2006  Job #: [id number]

## 2010-11-12 NOTE — Op Note (Signed)
[facility]  Patient:    Gary Parrish, Gary Parrish Visit Number: [id number] MRN: [id number]          Service Type: DSU Location: [facility] Attending Physician:  Ronne Binning Dictated by:   Nicki Reaper, M.D. Proc. Date: 11/01/01 Admit Date:  11/01/2001 Discharge Date: 11/01/2001   CC:         (2)   Operative Report  PREOPERATIVE DIAGNOSIS: Pigmented lesion, left thumb nail matrix.  POSTOPERATIVE DIAGNOSIS: Pigmented lesion, left thumb nail matrix.  OPERATION/PROCEDURE: Biopsy of lesion of nail bed, left thumb.  SURGEON: Nicki Reaper, M.D.  ANESTHESIA: IV regional.  ANESTHESIOLOGIST: Halford Decamp, M.D.  HISTORY: The patient is a 70 year old male, with a history of a pigmented lesion beneath his nail.  This has variably grown out to recur.  He has a raised area of the nail plate.  He was advised to consider biopsy of this but was extremely reluctant to have this done.  With encouragement he has elected to proceed.  PROCEDURE: The patient was brought to the operating room, where a forearm-based IV regional anesthetic was carried out without difficulty.  He was prepped and draped using DuraPrep.  The nail plate was removed using a periosteal elevator, taking care not to use the periosteal elevator in line of the linear darkened area.  The nail plate was removed without difficulty. This was found to be pigmented within the nail.  This was removed and sent to pathology.  A raised area was present just proximal to the darkened area.  An excisional biopsy was performed.  This was noted to go down to bone, was moderately vascular in nature.  This was excised in toto, along with a distal portion which was left long distally.  The area was irrigated.  The nail matrix was then able to be closed with interrupted 6-0 chromic sutures.  The actual lesion measured approximately 3 x 3 mm and was sent to pathology.  This was an ______ lunula.  A nonadherent  gauze was placed onto the nail fold.  A sterile compressive dressing was applied.  The patient tolerated the procedure well and was taken to the recovery room for observation in satisfactory condition.  He is discharged home to return in one week, on Vicodin and Keflex. Dictated by:   Nicki Reaper, M.D. Attending Physician:  Ronne Binning DD:  11/01/01 TD:  11/03/01 Job: [id number]

## 2010-12-23 ENCOUNTER — Other Ambulatory Visit: Payer: Self-pay | Admitting: Internal Medicine

## 2010-12-23 MED ORDER — BENAZEPRIL HCL 40 MG PO TABS: 40.0000 mg | ORAL_TABLET | Freq: Every day | ORAL | Status: DC

## 2010-12-23 NOTE — Telephone Encounter (Signed)
done

## 2011-03-04 ENCOUNTER — Institutional Professional Consult (permissible substitution): Payer: Self-pay | Admitting: Cardiovascular Disease

## 2011-03-09 ENCOUNTER — Encounter: Payer: Self-pay | Admitting: Cardiovascular Disease

## 2011-03-10 ENCOUNTER — Encounter: Payer: Self-pay | Admitting: Cardiovascular Disease

## 2011-03-10 ENCOUNTER — Ambulatory Visit (INDEPENDENT_AMBULATORY_CARE_PROVIDER_SITE_OTHER): Payer: Medicare Other | Admitting: Cardiovascular Disease

## 2011-03-10 VITALS — BP 136/88 | HR 72 | Ht 69.0 in | Wt 224.0 lb

## 2011-03-10 DIAGNOSIS — R079 Chest pain, unspecified: Secondary | ICD-10-CM

## 2011-03-10 DIAGNOSIS — I10 Essential (primary) hypertension: Secondary | ICD-10-CM

## 2011-03-10 DIAGNOSIS — F909 Attention-deficit hyperactivity disorder, unspecified type: Secondary | ICD-10-CM

## 2011-03-10 DIAGNOSIS — F988 Other specified behavioral and emotional disorders with onset usually occurring in childhood and adolescence: Secondary | ICD-10-CM | POA: Insufficient documentation

## 2011-03-10 NOTE — Assessment & Plan Note (Signed)
Seems more like a shoulder issue.  ETT

## 2011-03-10 NOTE — Patient Instructions (Signed)
Your physician has requested that you have an exercise tolerance test. For further information please visit [email or web address]. Please also follow instruction sheet, as given.   

## 2011-03-10 NOTE — Assessment & Plan Note (Signed)
Will see what BP is with exercise  May be better off with nonstimulant med like Blase Mess

## 2011-03-10 NOTE — Progress Notes (Signed)
70 yo referred by Dr Merla Riches.  Sees Dr Alwyn Ren as well.  Has ADD and trouble focusing  Had been on Adderall for a long time but Dr Merla Riches woudn;t prescribe it due to more labile BP.  Patient says he takes his meds.  [facility] indicates diuretic and ACE  Dr Jule Ser also has norvasc.  Patient tries to limit salt.  No history of chronic kidney disease.  Patient doesn't think Adderall makes BP worse.  Overweight but active doing water sports at [employer]  Some stress due to IRS problems.  Has some arm and shoulder pain that sounds arthritic.  Worse with activity but primarily upper arm.  No palpitations dyspnea or edema.    Discussed with patient Reasonable to do ETT to make sure no CAD and assess BP response to exercise before restarting Adderal since it can raise BP and cause arrhythmia  ROS: Denies fever, malais, weight loss, blurry vision, decreased visual acuity, cough, sputum, SOB, hemoptysis, pleuritic pain, palpitaitons, heartburn, abdominal pain, melena, lower extremity edema, claudication, or rash.  All other systems reviewed and negative   General: Affect appropriate Healthy:  appears stated age HEENT: normal Neck supple with no adenopathy JVP normal no bruits no thyromegaly Lungs clear with no wheezing and good diaphragmatic motion Heart:  S1/S2 no murmur,rub, gallop or click PMI normal Abdomen: benighn, BS positve, no tenderness, no AAA no bruit.  No HSM or HJR Distal pulses intact with no bruits No edema Neuro non-focal Skin warm and dry No muscular weakness  Medications Current Outpatient Prescriptions  Medication Sig Dispense Refill  . amphetamine-dextroamphetamine (ADDERALL) 15 MG tablet Take 15 mg by mouth daily.        Marland Kitchen aspirin 81 MG tablet Take 81 mg by mouth daily.        . benazepril (LOTENSIN) 40 MG tablet Take 1 tablet (40 mg total) by mouth daily.  90 tablet  0  . Bioflavonoid Products (GRAPE SEED PO) Take 1 tablet by mouth daily.        Marland Kitchen FIBER PO Take  1 tablet by mouth daily.        . fish oil-omega-3 fatty acids 1000 MG capsule Take 2 g by mouth daily.        Marland Kitchen GARLIC PO Take 1 tablet by mouth daily.        . hydrochlorothiazide (HYDRODIURIL) 12.5 MG tablet Take 1 tablet (12.5 mg total) by mouth daily.  90 tablet  0  . Multiple Vitamins-Minerals (VISION VITAMINS PO) Take 1 tablet by mouth daily.        . sildenafil (VIAGRA) 100 MG tablet Take 1 tablet (100 mg total) by mouth daily as needed.  6 tablet  1    Allergies Review of patient's allergies indicates no known allergies.  Family History: No family history on file.  Social History: History   Social History  . Marital Status: Married    Spouse Name: N/A    Number of Children: N/A  . Years of Education: N/A   Occupational History  . Not on file.   Social History Main Topics  . Smoking status: Never Smoker   . Smokeless tobacco: Not on file  . Alcohol Use: Not on file  . Drug Use: Not on file  . Sexually Active: Not on file   Other Topics Concern  . Not on file   Social History Narrative  . No narrative on file    Electrocardiogram:  NSR 72 LAFB othewise normal  Assessment and Plan

## 2011-03-10 NOTE — Assessment & Plan Note (Signed)
Sort actual med list  ETT  May need to add labatolol which may make adderall safe

## 2011-03-23 ENCOUNTER — Encounter: Payer: Medicare Other | Admitting: Physician Assistant

## 2011-03-24 LAB — COMPREHENSIVE METABOLIC PANEL
ALT: 23
BUN: 14
CO2: 26
Calcium: 9
Chloride: 103
Creatinine, Ser: 1.07
GFR calc non Af Amer: >60
Glucose, Bld: 109 — ABNORMAL HIGH
Sodium: 138
Total Bilirubin: 1.4 — ABNORMAL HIGH
Total Protein: 6.6

## 2011-03-24 LAB — DIFFERENTIAL
Eosinophils Absolute: 0
Lymphocytes Relative: 12
Lymphs Abs: 1.5
Monocytes Relative: 14 — ABNORMAL HIGH
Neutro Abs: 9.2 — ABNORMAL HIGH
Neutrophils Relative %: 73

## 2011-03-24 LAB — CBC
Hemoglobin: 15.4
MCHC: 34.9
MCV: 94.2
RBC: 4.68
RDW: 12.4

## 2011-03-24 LAB — PROTIME-INR
INR: 1.1
Prothrombin Time: 13.9

## 2011-03-24 LAB — APTT: aPTT: 34

## 2011-03-25 ENCOUNTER — Ambulatory Visit (INDEPENDENT_AMBULATORY_CARE_PROVIDER_SITE_OTHER): Payer: Medicare Other | Admitting: Cardiology

## 2011-03-25 ENCOUNTER — Ambulatory Visit (INDEPENDENT_AMBULATORY_CARE_PROVIDER_SITE_OTHER): Payer: Medicare Other

## 2011-03-25 DIAGNOSIS — R0989 Other specified symptoms and signs involving the circulatory and respiratory systems: Secondary | ICD-10-CM

## 2011-03-25 DIAGNOSIS — I10 Essential (primary) hypertension: Secondary | ICD-10-CM

## 2011-03-25 NOTE — Progress Notes (Signed)
Exercise Treadmill Test  Pre-Exercise Testing Evaluation Rhythm: normal sinus  Rate: 81   PR:  .17 QRS:  .11  QT:  .38 QTc: .44     Test  Exercise Tolerance Test Ordering MD: Charlton Haws, MD  Interpreting MD:  Angelina Sheriff, MD  Unique Test No: 1  Treadmill:  1  Indication for ETT: HTN  Contraindication to ETT: No   Stress Modality: exercise - treadmill  Cardiac Imaging Performed: non   Protocol: standard Bruce - maximal  Max BP:  186/75  Max MPHR (bpm):  151 85% MPR (bpm):  128   MPHR obtained (bpm):  133 % MPHR obtained:  86  Reached 85% MPHR (min:sec):  6:21 Total Exercise Time (min-sec):  6:30  Workload in METS:  7.7 Borg Scale: 15  Reason ETT Terminated:  desired heart rate attained    ST Segment Analysis At Rest: normal ST segments - no evidence of significant ST depression With Exercise: no evidence of significant ST depression  Other Information Arrhythmia:  Yes Angina during ETT:  absent (0) Quality of ETT:  diagnostic  ETT Interpretation:  normal - no evidence of ischemia by ST analysis  Comments: The patient had an moderate exercise tolerance.  There was no chest pain.  There was an appropriate level of dyspnea.  There was a normal heart rate response and normal BP response.  There were no ischemic ST T wave changes and a normal heart rate recovery.  He did have PACs but no symptoms with this.  Recommendations: Negative adequate ETT.  No further testing is indicated.  Based on the above I gave the patient a prescription for exercise.  His blood pressure response was not particularly high for this level of exertion. I will send this note back to Dr. Eden Emms who is evaluating the patient for the use of medications for ADD.

## 2011-03-25 NOTE — Progress Notes (Deleted)
Exercise Treadmill Test  Pre-Exercise Testing Evaluation Rhythm: normal sinus  Rate: 81   PR:  17 QRS:  .11  QT:  .38 QTc: .41     Test  Exercise Tolerance Test Ordering MD: Charlton Haws, MD  Interpreting MD:  Tereso Newcomer PA-C  Unique Test No: 1  Treadmill:  1  Indication for ETT: HTN  Contraindication to ETT: No   Stress Modality: exercise - treadmill  Cardiac Imaging Performed: non   Protocol: standard Bruce - maximal  Max BP:  ***/***  Max MPHR (bpm):  151 85% MPR (bpm):  128  MPHR obtained (bpm):  *** % MPHR obtained:  ***  Reached 85% MPHR (min:sec):  *** Total Exercise Time (min-sec):  ***  Workload in METS:  *** Borg Scale: ***  Reason ETT Terminated:  {CHL REASON TERMINATED FOR ETT:21021064}    ST Segment Analysis At Rest: {CHL ST SEGMENT AT REST FOR ZOX:09604540} With Exercise: {CHL ST SEGMENT WITH EXERCISE FOR JWJ:19147829}  Other Information Arrhythmia:  {CHL ARRHYTHMIA FOR FAO:13086578} Angina during ETT:  {CHL ANGINA DURING ION:62952841} Quality of ETT:  {CHL QUALITY OF LKG:40102725}  ETT Interpretation:  {CHL INTERPRETATION FOR DGU:44034742}  Comments: ***  Recommendations: ***

## 2011-04-04 ENCOUNTER — Telehealth: Payer: Self-pay | Admitting: Cardiovascular Disease

## 2011-04-04 NOTE — Telephone Encounter (Signed)
Pt has returned your call again.

## 2011-04-04 NOTE — Telephone Encounter (Signed)
Spoke with pt, aware GXT was normal. Copy to dr Merla Riches Deliah Goody

## 2011-04-04 NOTE — Telephone Encounter (Signed)
Pt returning your call

## 2011-04-05 ENCOUNTER — Telehealth: Payer: Self-pay | Admitting: *Deleted

## 2011-04-05 NOTE — Telephone Encounter (Signed)
PT AWARE TO SEE  PMD WOULD CHANGE HIS ADDERAL  TO STRATERRA  D/T ELEVATED B/P WHILE ON TREADMILL./CY

## 2011-06-09 ENCOUNTER — Ambulatory Visit (INDEPENDENT_AMBULATORY_CARE_PROVIDER_SITE_OTHER): Payer: Medicare Other

## 2011-06-09 DIAGNOSIS — R05 Cough: Secondary | ICD-10-CM

## 2011-06-09 DIAGNOSIS — Z23 Encounter for immunization: Secondary | ICD-10-CM

## 2011-06-09 DIAGNOSIS — J069 Acute upper respiratory infection, unspecified: Secondary | ICD-10-CM

## 2011-06-28 DIAGNOSIS — I639 Cerebral infarction, unspecified: Secondary | ICD-10-CM

## 2011-06-28 HISTORY — DX: Cerebral infarction, unspecified: I63.9

## 2011-06-28 HISTORY — PX: OTHER SURGICAL HISTORY: SHX169

## 2011-07-13 ENCOUNTER — Ambulatory Visit (INDEPENDENT_AMBULATORY_CARE_PROVIDER_SITE_OTHER): Payer: Medicare Other | Admitting: Internal Medicine

## 2011-07-13 DIAGNOSIS — M25529 Pain in unspecified elbow: Secondary | ICD-10-CM

## 2011-07-13 DIAGNOSIS — F988 Other specified behavioral and emotional disorders with onset usually occurring in childhood and adolescence: Secondary | ICD-10-CM

## 2011-08-29 ENCOUNTER — Telehealth: Payer: Self-pay | Admitting: Internal Medicine

## 2011-08-29 NOTE — Telephone Encounter (Signed)
Received fax for  Amlodipine Besylate 5MG  Tablet #90 Last fill date 05/16/11

## 2011-08-30 NOTE — Telephone Encounter (Signed)
Left message on voicemail for patient to return call when available. Reason for call #1- med not on list  #2 -last OV 02/23/2010, patient can schedule OV with Dr.Hopper or call cardiology for refill

## 2011-08-31 NOTE — Telephone Encounter (Signed)
Spoke with patient, patient agreed to calling cardiology for refill on medication

## 2011-09-23 ENCOUNTER — Telehealth: Payer: Self-pay

## 2011-09-23 NOTE — Telephone Encounter (Signed)
Pt called stating that he needs a refill on his amlodieine he has 3 left for his blood pressure and the pharmacy told him to call -dfb  Best number [phone]   [facility]  [phone]

## 2011-09-26 NOTE — Telephone Encounter (Signed)
This patient does not see [place] for his bp.  Saw [place] last October for elbow injury.  He is a patient of Dr. Alroy Bailiff office, and has been advised by them that he needs either an OV or to get his Norvasc from his cardiologist.

## 2011-09-26 NOTE — Telephone Encounter (Signed)
Chart pulled to PA 

## 2011-09-26 NOTE — Telephone Encounter (Signed)
Please have patient call the pharmacy to send [place] a request, we can refill much faster if he calls his pharmacy, will refill through June.  Thanks!

## 2011-10-03 ENCOUNTER — Telehealth: Payer: Self-pay

## 2011-10-03 NOTE — Telephone Encounter (Signed)
Pt called and needs all 3 of his BP medications refilled. He uses [facility].

## 2011-10-03 NOTE — Telephone Encounter (Signed)
Chart reviewed with Gary Parrish... Appears we have not written patient for his bp meds.  No labs have ever been drawn.  Patient has seen Dr. Eden Emms last September, but refuses to contact them for med refill.  Patient advised to RTC for bp eval/labs before rx can be written.

## 2011-10-04 ENCOUNTER — Ambulatory Visit (INDEPENDENT_AMBULATORY_CARE_PROVIDER_SITE_OTHER): Payer: Medicare Other | Admitting: Family Medicine

## 2011-10-04 VITALS — BP 131/84 | HR 65 | Temp 98.0°F | Resp 18 | Ht 68.5 in | Wt 225.8 lb

## 2011-10-04 DIAGNOSIS — I10 Essential (primary) hypertension: Secondary | ICD-10-CM | POA: Diagnosis not present

## 2011-10-04 MED ORDER — HYDROCHLOROTHIAZIDE 12.5 MG PO TABS: 12.5000 mg | ORAL_TABLET | Freq: Every day | ORAL | Status: DC

## 2011-10-04 MED ORDER — AMLODIPINE BESYLATE 5 MG PO TABS: 5.0000 mg | ORAL_TABLET | Freq: Every day | ORAL | Status: DC

## 2011-10-04 MED ORDER — BENAZEPRIL HCL 40 MG PO TABS: 40.0000 mg | ORAL_TABLET | Freq: Every day | ORAL | Status: DC

## 2011-10-04 NOTE — Progress Notes (Signed)
  Subjective:    Patient ID: Gary Parrish, male    DOB: [birth year], 71 y.o.   MRN: [id number]  HPI Gary Parrish is a 71 y.o. male  HTN- home bp's120-140/70-90.  No new med side effects.  No lightheadedness or dizziness.    Review of Systems  Constitutional: Negative for fatigue and unexpected weight change.  Eyes: Negative for visual disturbance.  Respiratory: Negative for cough, chest tightness and shortness of breath.   Cardiovascular: Negative for chest pain, palpitations and leg swelling.  Gastrointestinal: Negative for abdominal pain and blood in stool.       Occassional hemorrhoid.  Neurological: Negative for dizziness, light-headedness and headaches.       Objective:   Physical Exam  Constitutional: He is oriented to person, place, and time. He appears well-developed and well-nourished.  HENT:  Head: Normocephalic and atraumatic.  Eyes: EOM are normal. Pupils are equal, round, and reactive to light.  Neck: No JVD present. Carotid bruit is not present.  Cardiovascular: Normal rate, regular rhythm and normal heart sounds.   No murmur heard. Pulmonary/Chest: Effort normal and breath sounds normal. He has no rales.  Abdominal: Soft. Normal appearance. He exhibits no abdominal bruit and no pulsatile midline mass. There is no tenderness.  Musculoskeletal: He exhibits no edema.  Neurological: He is alert and oriented to person, place, and time.  Skin: Skin is warm and dry.  Psychiatric: He has a normal mood and affect. His behavior is normal.          Assessment & Plan:  Gary Parrish is a 71 y.o. male 1. HTN (hypertension)  Comprehensive metabolic panel, Lipid panel   Controlled.  Fasting today - check CMP and lipids.  refillled meds for 6 months, but needs to schedule CPE.

## 2011-10-04 NOTE — Patient Instructions (Signed)
Your lab results should return within the next 7-10 days. Schedule a complete physical in the next 6 months.

## 2011-10-05 LAB — COMPREHENSIVE METABOLIC PANEL
ALT: 19 U/L (ref 0–53)
AST: 21 U/L (ref 0–37)
Albumin: 4.2 g/dL (ref 3.5–5.2)
Alkaline Phosphatase: 83 U/L (ref 39–117)
BUN: 18 mg/dL (ref 6–23)
Potassium: 4.4 mEq/L (ref 3.5–5.3)
Sodium: 138 mEq/L (ref 135–145)

## 2011-10-05 LAB — LIPID PANEL
HDL: 45 mg/dL (ref >39)
LDL Cholesterol: 114 mg/dL — ABNORMAL HIGH (ref 0–99)

## 2011-10-11 ENCOUNTER — Telehealth: Payer: Self-pay

## 2011-10-11 MED ORDER — AMPHETAMINE-DEXTROAMPHETAMINE 15 MG PO TABS: 15.0000 mg | ORAL_TABLET | Freq: Every day | ORAL | Status: DC

## 2011-10-11 NOTE — Telephone Encounter (Signed)
adderall refill - 3 months supply  Please call when ready for pick up [phone]

## 2011-10-11 NOTE — Telephone Encounter (Signed)
Patient notified.  Rx in p/u drawer.

## 2011-10-11 NOTE — Telephone Encounter (Signed)
Rx's written and ready for pick up. Patient will need an OV before he is out of these 3 months.  Gary Parrish

## 2011-10-26 DIAGNOSIS — C61 Malignant neoplasm of prostate: Secondary | ICD-10-CM | POA: Diagnosis not present

## 2011-11-30 ENCOUNTER — Encounter: Payer: Self-pay | Admitting: Internal Medicine

## 2011-11-30 ENCOUNTER — Ambulatory Visit (INDEPENDENT_AMBULATORY_CARE_PROVIDER_SITE_OTHER): Payer: Medicare Other | Admitting: Internal Medicine

## 2011-11-30 VITALS — BP 134/86 | HR 74 | Temp 98.1°F | Resp 16 | Ht 68.0 in | Wt 225.8 lb

## 2011-11-30 DIAGNOSIS — I10 Essential (primary) hypertension: Secondary | ICD-10-CM

## 2011-11-30 DIAGNOSIS — F988 Other specified behavioral and emotional disorders with onset usually occurring in childhood and adolescence: Secondary | ICD-10-CM | POA: Diagnosis not present

## 2011-11-30 MED ORDER — AMLODIPINE BESYLATE 5 MG PO TABS: 5.0000 mg | ORAL_TABLET | Freq: Every day | ORAL | Status: DC

## 2011-11-30 MED ORDER — AMPHETAMINE-DEXTROAMPHETAMINE 15 MG PO TABS: 15.0000 mg | ORAL_TABLET | Freq: Every day | ORAL | Status: DC

## 2011-11-30 MED ORDER — BENAZEPRIL HCL 40 MG PO TABS: 40.0000 mg | ORAL_TABLET | Freq: Every day | ORAL | Status: DC

## 2011-11-30 MED ORDER — HYDROCHLOROTHIAZIDE 12.5 MG PO TABS: 12.5000 mg | ORAL_TABLET | Freq: Every day | ORAL | Status: DC

## 2011-11-30 NOTE — Progress Notes (Signed)
Subjective:    Patient ID: Gary Parrish, male    DOB: 07-09-1940, 71 y.o.   MRN: [id number]  HPIHere for followup for hypertension and adult ADD Doing very well her current dose of medications Takes one dose of Adderall in the morning and this helps greatly in his work   Hypertension has been stable and he has been very active without any chest pain, palpitations, dyspnea on exertion or peripheral edema/he can't remember whether he had abdominal ultrasound  Labs were done by Dr. Chilton Si in April and were within normal limits  Lab Results  Component Value Date   WBC 8.6 01/01/2010   HGB 17.4* 01/01/2010   HCT 49.7 01/01/2010   PLT 252.0 01/01/2010   GLUCOSE 112* 10/04/2011   CHOL 197 10/04/2011   TRIG 189* 10/04/2011   HDL 45 10/04/2011   LDLDIRECT 129.2 10/22/2008   LDLCALC 114* 10/04/2011   ALT 19 10/04/2011   AST 21 10/04/2011   NA 138 10/04/2011   K 4.4 10/04/2011   CL 102 10/04/2011   CREATININE 1.01 10/04/2011   BUN 18 10/04/2011   CO2 26 10/04/2011   TSH 0.96 01/01/2010   PSA 1.49 02/11/2009   INR 1.1 12/01/2007   HGBA1C 5.4 10/22/2008       Review of Systems HEENT negative GI -stable diverticulosis and GERD GU-stable post treatment for prostate cancer Neurological-no tics or sleep disorders Psychiatric-no mood changes or anxiety     Objective:   Physical Exam  Constitutional: He is oriented to person, place, and time. He appears well-developed and well-nourished.  Eyes: Conjunctivae and EOM are normal. Pupils are equal, round, and reactive to light.  Neck: No thyromegaly present.  Cardiovascular: Normal rate, regular rhythm, normal heart sounds and intact distal pulses.   Pulmonary/Chest: Effort normal and breath sounds normal.  Neurological: He is alert and oriented to person, place, and time. No cranial nerve deficit.  Psychiatric: He has a normal mood and affect. His behavior is normal. Judgment and thought content normal.          Assessment & Plan:  Problem #1 hypertension Problem  #2 ADD Problem #3 hyperlipidemia Problem #4 GERD Problem #5 history of prostate cancer  Meds ordered this encounter  Medications  . amphetamine-dextroamphetamine (ADDERALL) 15 MG tablet    Sig: Take 1 tablet (15 mg total) by mouth daily. For after 12/08/11    Dispense:  30 tablet    Refill:  0  . amphetamine-dextroamphetamine (ADDERALL) 15 MG tablet    Sig: Take 1 tablet (15 mg total) by mouth daily. For after 01/07/12    Dispense:  30 tablet    Refill:  0  . amphetamine-dextroamphetamine (ADDERALL) 15 MG tablet    Sig: Take 1 tablet (15 mg total) by mouth daily. For after 02/08/12    Dispense:  30 tablet    Refill:  0  . hydrochlorothiazide (HYDRODIURIL) 12.5 MG tablet    Sig: Take 1 tablet (12.5 mg total) by mouth daily.    Dispense:  90 tablet    Refill:  1    APPOINTMENT DUE 01/2011  .               Marland Kitchen amLODipine (NORVASC) 5 MG tablet    Sig: Take 1 tablet (5 mg total) by mouth daily.    Dispense:  90 tablet    Refill:  1  . benazepril (LOTENSIN) 40 MG tablet    Sig: Take 1 tablet (40 mg total) by mouth daily.  Dispense:  90 tablet    Refill:  1

## 2011-12-08 DIAGNOSIS — C61 Malignant neoplasm of prostate: Secondary | ICD-10-CM | POA: Diagnosis not present

## 2011-12-26 ENCOUNTER — Ambulatory Visit (INDEPENDENT_AMBULATORY_CARE_PROVIDER_SITE_OTHER): Payer: Medicare Other | Admitting: Family Medicine

## 2011-12-26 VITALS — BP 146/89 | HR 87 | Temp 98.5°F | Resp 18 | Ht 68.0 in | Wt 224.6 lb

## 2011-12-26 DIAGNOSIS — J029 Acute pharyngitis, unspecified: Secondary | ICD-10-CM

## 2011-12-26 LAB — POCT RAPID STREP A (OFFICE): Rapid Strep A Screen: NEGATIVE

## 2011-12-26 MED ORDER — AMOXICILLIN 875 MG PO TABS: 875.0000 mg | ORAL_TABLET | Freq: Two times a day (BID) | ORAL | Status: AC

## 2011-12-26 NOTE — Patient Instructions (Addendum)
phPharyngitis, Viral and Bacterial Pharyngitis is soreness (inflammation) or infection of the pharynx. It is also called a sore throat. CAUSES  Most sore throats are caused by viruses and are part of a cold. However, some sore throats are caused by strep and other bacteria. Sore throats can also be caused by post nasal drip from draining sinuses, allergies and sometimes from sleeping with an open mouth. Infectious sore throats can be spread from person to person by coughing, sneezing and sharing cups or eating utensils. TREATMENT  Sore throats that are viral usually last 3-4 days. Viral illness will get better without medications (antibiotics). Strep throat and other bacterial infections will usually begin to get better about 24-48 hours after you begin to take antibiotics. HOME CARE INSTRUCTIONS   If the caregiver feels there is a bacterial infection or if there is a positive strep test, they will prescribe an antibiotic. The full course of antibiotics must be taken. If the full course of antibiotic is not taken, you or your child may become ill again. If you or your child has strep throat and do not finish all of the medication, serious heart or kidney diseases may develop.   Drink enough water and fluids to keep your urine clear or pale yellow.   Only take over-the-counter or prescription medicines for pain, discomfort or fever as directed by your caregiver.   Get lots of rest.   Gargle with salt water ( tsp. of salt in a glass of water) as often as every 1-2 hours as you need for comfort.   Hard candies may soothe the throat if individual is not at risk for choking. Throat sprays or lozenges may also be used.  SEEK MEDICAL CARE IF:   Large, tender lumps in the neck develop.   A rash develops.   Green, yellow-brown or bloody sputum is coughed up.   Your baby is older than 3 months with a rectal temperature of 100.5 F (38.1 C) or higher for more than 1 day.  SEEK IMMEDIATE MEDICAL  CARE IF:   A stiff neck develops.   You or your child are drooling or unable to swallow liquids.   You or your child are vomiting, unable to keep medications or liquids down.   You or your child has severe pain, unrelieved with recommended medications.   You or your child are having difficulty breathing (not due to stuffy nose).   You or your child are unable to fully open your mouth.   You or your child develop redness, swelling, or severe pain anywhere on the neck.   You have a fever.   Your baby is older than 3 months with a rectal temperature of 102 F (38.9 C) or higher.   Your baby is 13 months old or younger with a rectal temperature of 100.4 F (38 C) or higher.  MAKE SURE YOU:   Understand these instructions.   Will watch your condition.   Will get help right away if you are not doing well or get worse.  Document Released: 06/13/2005 Document Revised: 06/02/2011 Document Reviewed: 09/10/2007 [facility] Patient Information 2012 [place], [place].

## 2011-12-26 NOTE — Progress Notes (Signed)
Subjective: Patient has had a severe sore throat since Saturday. No fever no other symptoms really. He worries about encephalitis. He is normally. He has ears don't hurt. Not had running nose. The cough. Has not been sick the stomach. He just feels rotten. Still works regularly. 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  "[employer]"  Objective: Overweight white male ill appearing. His TMs are normal. Throat erythematous with more tonsillar enlargement on the right than the left. No exudate. Strep screen and culture were taken. Neck supple without significant nodes. Chest is clear to auscultation. Heart regular without murmurs.  Assessment: Pharyngitis  Plan: Checked for strep screen and then decide treatment accordingly.  Results for orders placed in visit on 12/26/11  POCT RAPID STREP A (OFFICE)      Component Value Range   Rapid Strep A Screen Negative  Negative

## 2011-12-28 LAB — CULTURE, GROUP A STREP: Organism ID, Bacteria: NORMAL

## 2012-01-11 ENCOUNTER — Ambulatory Visit: Payer: Medicare Other | Admitting: Internal Medicine

## 2012-03-06 ENCOUNTER — Telehealth: Payer: Self-pay

## 2012-03-06 NOTE — Telephone Encounter (Signed)
It appears that he got an extra prescription in June. He got that extra prescription filled at a different pharmacy in a different town. Thus, we cannot give him another prescription at this time.   DEA database indicates patient got two Adderall prescriptions filled on the same day at two different pharmacies on 12/10/11. Please forward this to Dr. Merla Riches after notifying the patient that we will not be giving him a refill.

## 2012-03-06 NOTE — Telephone Encounter (Signed)
Pt will be going out of town next week and wanted to get a refill for his Adderall before Friday.

## 2012-03-07 DIAGNOSIS — H353190 Nonexudative age-related macular degeneration, unspecified eye, stage unspecified: Secondary | ICD-10-CM | POA: Diagnosis not present

## 2012-03-07 NOTE — Telephone Encounter (Signed)
Gave pt message from [place], and pt stated that he doesn't know what happened, evidently he must have another Rx out there somewhere. Gave him date that the Rxs were filled and pt stated that was OK and transferred him to 104 to set up appt w/Dr Merla Riches.  Dr Merla Riches, forwarding this [facility]

## 2012-04-11 ENCOUNTER — Encounter: Payer: Self-pay | Admitting: Internal Medicine

## 2012-04-11 ENCOUNTER — Ambulatory Visit (INDEPENDENT_AMBULATORY_CARE_PROVIDER_SITE_OTHER): Payer: Medicare Other | Admitting: Internal Medicine

## 2012-04-11 VITALS — BP 140/88 | HR 80 | Temp 97.9°F | Resp 16 | Ht 68.0 in | Wt 226.0 lb

## 2012-04-11 DIAGNOSIS — Z23 Encounter for immunization: Secondary | ICD-10-CM

## 2012-04-11 DIAGNOSIS — F988 Other specified behavioral and emotional disorders with onset usually occurring in childhood and adolescence: Secondary | ICD-10-CM | POA: Diagnosis not present

## 2012-04-11 MED ORDER — AMPHETAMINE-DEXTROAMPHETAMINE 15 MG PO TABS: 15.0000 mg | ORAL_TABLET | Freq: Every day | ORAL | Status: DC

## 2012-04-16 NOTE — Progress Notes (Signed)
Followup for attention deficit disorder His routine care is by Dr. Marga Melnick  remains stable at this dose of medication which greatly helps him at work  1. Need for influenza vaccination  Flu vaccine greater than or equal to 71yo preservative free IM  2. ADD (attention deficit disorder)     Meds ordered this encounter  Medications  . amphetamine-dextroamphetamine (ADDERALL) 15 MG tablet    Sig: Take 1 tablet (15 mg total) by mouth daily. For after 711/16/13    Dispense:  30 tablet    Refill:  0  . amphetamine-dextroamphetamine (ADDERALL) 15 MG tablet    Sig: Take 1 tablet (15 mg total) by mouth daily. For after 06/11/12    Dispense:  30 tablet    Refill:  0  . amphetamine-dextroamphetamine (ADDERALL) 15 MG tablet    Sig: Take 1 tablet (15 mg total) by mouth daily.    Dispense:  30 tablet    Refill:  0

## 2012-05-06 ENCOUNTER — Other Ambulatory Visit: Payer: Self-pay | Admitting: Physician Assistant

## 2012-05-06 MED ORDER — SILDENAFIL CITRATE 100 MG PO TABS: 100.0000 mg | ORAL_TABLET | Freq: Every day | ORAL | Status: DC | PRN

## 2012-05-10 ENCOUNTER — Other Ambulatory Visit: Payer: Self-pay | Admitting: *Deleted

## 2012-05-10 MED ORDER — SILDENAFIL CITRATE 100 MG PO TABS: 100.0000 mg | ORAL_TABLET | Freq: Every day | ORAL | Status: DC | PRN

## 2012-05-22 ENCOUNTER — Telehealth: Payer: Self-pay | Admitting: Physician Assistant

## 2012-05-22 MED ORDER — BENAZEPRIL HCL 40 MG PO TABS: 40.0000 mg | ORAL_TABLET | Freq: Every day | ORAL | Status: DC

## 2012-05-22 NOTE — Telephone Encounter (Signed)
Needs refill of lisinopril

## 2012-06-27 DIAGNOSIS — Z0271 Encounter for disability determination: Secondary | ICD-10-CM

## 2012-07-12 ENCOUNTER — Telehealth: Payer: Self-pay

## 2012-07-12 NOTE — Telephone Encounter (Signed)
PT IN NEED OF HIS ADDERALL. PLEASE CALL [phone]

## 2012-07-14 MED ORDER — AMPHETAMINE-DEXTROAMPHETAMINE 15 MG PO TABS: 15.0000 mg | ORAL_TABLET | Freq: Every day | ORAL | Status: DC

## 2012-07-14 NOTE — Telephone Encounter (Signed)
[facility] Rx ready to pick up. 

## 2012-07-14 NOTE — Telephone Encounter (Signed)
No orders of the defined types were placed in this encounter.   But then  Meds ordered this encounter  Medications  . amphetamine-dextroamphetamine (ADDERALL) 15 MG tablet    Sig: Take 1 tablet (15 mg total) by mouth daily. For after 06/11/12    Dispense:  30 tablet    Refill:  0   F/u 1 mo

## 2012-07-16 ENCOUNTER — Telehealth: Payer: Self-pay | Admitting: *Deleted

## 2012-07-16 MED ORDER — SILDENAFIL CITRATE 100 MG PO TABS: 100.0000 mg | ORAL_TABLET | Freq: Every day | ORAL | Status: DC | PRN

## 2012-07-16 NOTE — Telephone Encounter (Signed)
rx faxed to pharmacy

## 2012-07-16 NOTE — Telephone Encounter (Signed)
I have printed off the Rx. I would have the patient come and get and he can fax.

## 2012-07-16 NOTE — Telephone Encounter (Signed)
[place] [facility] requesting refill on viagra 100mg  #20.  Fax number [phone]

## 2012-07-18 ENCOUNTER — Encounter: Payer: Self-pay | Admitting: Internal Medicine

## 2012-07-18 ENCOUNTER — Ambulatory Visit (INDEPENDENT_AMBULATORY_CARE_PROVIDER_SITE_OTHER): Payer: Medicare Other | Admitting: Internal Medicine

## 2012-07-18 VITALS — BP 116/76 | HR 78 | Temp 98.8°F | Resp 16 | Ht 68.0 in | Wt 225.4 lb

## 2012-07-18 DIAGNOSIS — F988 Other specified behavioral and emotional disorders with onset usually occurring in childhood and adolescence: Secondary | ICD-10-CM | POA: Diagnosis not present

## 2012-07-18 MED ORDER — AMPHETAMINE-DEXTROAMPHET ER 25 MG PO CP24: 25.0000 mg | ORAL_CAPSULE | ORAL | Status: DC

## 2012-07-20 ENCOUNTER — Encounter: Payer: Self-pay | Admitting: Internal Medicine

## 2012-07-20 NOTE — Progress Notes (Signed)
  Subjective:    Patient ID: Gary Parrish, male    DOB: [birth year], 72 y.o.   MRN: [id number]  HPI here for followup for attention deficit disorder Continues to work and use medication to decrease distractibility and procrastination Continues to work well without side effects No apparent elevation of blood pressure  Patient Active Problem List  Diagnosis  . HYPERLIPIDEMIA  . METABOLIC SYNDROME X  . GERD  . DIVERTICULOSIS, COLON  . HYPERPLASIA PROSTATE UNS W/O UR OBST & OTH LUTS  . HIP PAIN, RIGHT  . KNEE PAIN, RIGHT  . FATIGUE  . CHEST DISCOMFORT  . UNS ADVRS EFF UNS RX MEDICINAL&BIOLOGICAL SBSTNC  . PROSTATE CANCER, HX OF  . SKIN CANCER, HX OF  . COLONIC POLYPS, HX OF  . ADD (attention deficit disorder with hyperactivity)  . HTN (hypertension)   Other medications include Norvasc Lotensin hydrochlorothiazide omeprazole    Review of Systems No headaches or visual changes No chest pain or palpitations No excessive sweating No insomnia No weight loss    Objective:   Physical Exam Vital signs stable HEENT clear Heart regular Neurological intact       Assessment & Plan:  Problem #1 attention deficit disorder Meds ordered this encounter  Medications  . amphetamine-dextroamphetamine (ADDERALL XR) 25 MG 24 hr capsule    Sig: Take 1 capsule (25 mg total) by mouth every morning.    Dispense:  60 capsule    Refill:  0  . amphetamine-dextroamphetamine (ADDERALL XR) 25 MG 24 hr capsule    Sig: Take 1 capsule (25 mg total) by mouth every morning. After 08/18/12    Dispense:  30 capsule    Refill:  0  . amphetamine-dextroamphetamine (ADDERALL XR) 25 MG 24 hr capsule    Sig: Take 1 capsule (25 mg total) by mouth every morning. After 09/15/12    Dispense:  30 capsule    Refill:  0   Obviously the first prescription with 60 should be 30 capsules His primary care was Dr. Marga Melnick and but he is in transition and has been getting his blood pressure medicines in our  clinic/he will need followup at some point and should schedule time for an overall evaluation

## 2012-08-19 ENCOUNTER — Other Ambulatory Visit: Payer: Self-pay | Admitting: *Deleted

## 2012-08-19 MED ORDER — AMLODIPINE BESYLATE 5 MG PO TABS: 5.0000 mg | ORAL_TABLET | Freq: Every day | ORAL | Status: DC

## 2012-08-30 ENCOUNTER — Telehealth: Payer: Self-pay

## 2012-08-30 NOTE — Telephone Encounter (Signed)
PT WOULD LIKE TO HAVE A 30 DAY SUPPLY OF VIAGRA INSTEAD OF THE 12. PLEASE CALL [phone]   THE PHARMACY IS IN [place] AT [phone]

## 2012-08-30 NOTE — Telephone Encounter (Signed)
Can you print off a larger quantity of the Viagra? I will give him the Rx and he can send to [place].

## 2012-09-03 MED ORDER — SILDENAFIL CITRATE 100 MG PO TABS: 100.0000 mg | ORAL_TABLET | Freq: Every day | ORAL | Status: DC | PRN

## 2012-09-03 NOTE — Telephone Encounter (Signed)
I will sign at the TL desk.

## 2012-09-04 NOTE — Telephone Encounter (Signed)
lmom that rx is ready for pickup.  

## 2012-10-01 ENCOUNTER — Telehealth: Payer: Self-pay | Admitting: *Deleted

## 2012-10-01 NOTE — Telephone Encounter (Signed)
I spent over 4 hours on paperwork this weekend; I saw no forms from his insurance company. He should have it faxed to the attention of Janna Arch

## 2012-10-01 NOTE — Telephone Encounter (Signed)
Pt called stating that his insurance company faxed over paperwork to be completed & the pt was checking on the status of this. Pt is requesting a call back from Dr. Alwyn Ren himself. Please advise.

## 2012-10-01 NOTE — Telephone Encounter (Signed)
Discuss with patient  

## 2012-10-01 NOTE — Telephone Encounter (Signed)
I have reviewed ALL papers that I have, checked green file folder. Dr.Hopper are you familiar with paperwork that patient is referring to?

## 2012-10-02 ENCOUNTER — Telehealth: Payer: Self-pay | Admitting: Internal Medicine

## 2012-10-02 NOTE — Telephone Encounter (Signed)
If there is a valid request for release of records signed by the patient;this is something the physician does not need to review.  Please send medical records  once the appropriate documentation is verified. Thanks

## 2012-10-02 NOTE — Telephone Encounter (Signed)
Caller: [name]/Other; Phone: [phone]; Reason for Call: Calling from insurance company to ensure that Gary Parrish has received the request that was faxed over on 10/01/12. Spoke with [name] and she has not received any requests for this patient. Verified correct fax number for [name]: [phone]. Insurance company is requesting records for prostate cancer, skin cancer, colon polyps, and chest discomfort.  Cancer records, cardiology records, and GI records.  Fax number to send information to: [name], [phone].  Please contact [name] if you have any questions.  Thanks.

## 2012-10-02 NOTE — Telephone Encounter (Signed)
Insurance company selected CAN option on phone listing.  Spoke with CAN and advised I had not received the fax, and asked to resend.  Amy received the fax and is following up appropriately.

## 2012-10-29 ENCOUNTER — Other Ambulatory Visit: Payer: Self-pay

## 2012-10-29 MED ORDER — AMLODIPINE BESYLATE 5 MG PO TABS: 5.0000 mg | ORAL_TABLET | Freq: Every day | ORAL | Status: DC

## 2012-10-29 MED ORDER — BENAZEPRIL HCL 40 MG PO TABS: 40.0000 mg | ORAL_TABLET | Freq: Every day | ORAL | Status: DC

## 2012-10-29 MED ORDER — HYDROCHLOROTHIAZIDE 12.5 MG PO TABS: 12.5000 mg | ORAL_TABLET | Freq: Every day | ORAL | Status: DC

## 2012-11-06 DIAGNOSIS — K5900 Constipation, unspecified: Secondary | ICD-10-CM | POA: Diagnosis not present

## 2012-11-06 DIAGNOSIS — I059 Rheumatic mitral valve disease, unspecified: Secondary | ICD-10-CM | POA: Diagnosis not present

## 2012-11-06 DIAGNOSIS — Z23 Encounter for immunization: Secondary | ICD-10-CM | POA: Diagnosis not present

## 2012-11-06 DIAGNOSIS — R7309 Other abnormal glucose: Secondary | ICD-10-CM | POA: Diagnosis not present

## 2012-11-06 DIAGNOSIS — I6350 Cerebral infarction due to unspecified occlusion or stenosis of unspecified cerebral artery: Secondary | ICD-10-CM | POA: Diagnosis not present

## 2012-11-06 DIAGNOSIS — S8990XA Unspecified injury of unspecified lower leg, initial encounter: Secondary | ICD-10-CM | POA: Diagnosis not present

## 2012-11-06 DIAGNOSIS — I10 Essential (primary) hypertension: Secondary | ICD-10-CM | POA: Diagnosis not present

## 2012-11-06 DIAGNOSIS — R4182 Altered mental status, unspecified: Secondary | ICD-10-CM | POA: Diagnosis not present

## 2012-11-06 DIAGNOSIS — F988 Other specified behavioral and emotional disorders with onset usually occurring in childhood and adolescence: Secondary | ICD-10-CM | POA: Diagnosis not present

## 2012-11-06 DIAGNOSIS — R5381 Other malaise: Secondary | ICD-10-CM | POA: Diagnosis not present

## 2012-11-06 DIAGNOSIS — E785 Hyperlipidemia, unspecified: Secondary | ICD-10-CM | POA: Diagnosis not present

## 2012-11-06 DIAGNOSIS — R471 Dysarthria and anarthria: Secondary | ICD-10-CM | POA: Diagnosis not present

## 2012-11-06 DIAGNOSIS — R5383 Other fatigue: Secondary | ICD-10-CM | POA: Diagnosis not present

## 2012-11-06 DIAGNOSIS — Z8546 Personal history of malignant neoplasm of prostate: Secondary | ICD-10-CM | POA: Diagnosis not present

## 2012-11-06 DIAGNOSIS — I6789 Other cerebrovascular disease: Secondary | ICD-10-CM | POA: Diagnosis not present

## 2012-11-06 DIAGNOSIS — S99929A Unspecified injury of unspecified foot, initial encounter: Secondary | ICD-10-CM | POA: Diagnosis not present

## 2012-11-06 DIAGNOSIS — R42 Dizziness and giddiness: Secondary | ICD-10-CM | POA: Diagnosis not present

## 2012-11-06 DIAGNOSIS — S99919A Unspecified injury of unspecified ankle, initial encounter: Secondary | ICD-10-CM | POA: Diagnosis not present

## 2012-11-06 DIAGNOSIS — Z7982 Long term (current) use of aspirin: Secondary | ICD-10-CM | POA: Diagnosis not present

## 2012-11-06 DIAGNOSIS — I498 Other specified cardiac arrhythmias: Secondary | ICD-10-CM | POA: Diagnosis not present

## 2012-11-06 DIAGNOSIS — I6359 Cerebral infarction due to unspecified occlusion or stenosis of other cerebral artery: Secondary | ICD-10-CM | POA: Diagnosis not present

## 2012-11-09 DIAGNOSIS — I69919 Unspecified symptoms and signs involving cognitive functions following unspecified cerebrovascular disease: Secondary | ICD-10-CM | POA: Diagnosis not present

## 2012-11-09 DIAGNOSIS — E785 Hyperlipidemia, unspecified: Secondary | ICD-10-CM | POA: Diagnosis not present

## 2012-11-09 DIAGNOSIS — R5381 Other malaise: Secondary | ICD-10-CM | POA: Diagnosis not present

## 2012-11-09 DIAGNOSIS — I69922 Dysarthria following unspecified cerebrovascular disease: Secondary | ICD-10-CM | POA: Diagnosis not present

## 2012-11-09 DIAGNOSIS — Z923 Personal history of irradiation: Secondary | ICD-10-CM | POA: Diagnosis not present

## 2012-11-09 DIAGNOSIS — I10 Essential (primary) hypertension: Secondary | ICD-10-CM | POA: Diagnosis not present

## 2012-11-09 DIAGNOSIS — F909 Attention-deficit hyperactivity disorder, unspecified type: Secondary | ICD-10-CM | POA: Diagnosis not present

## 2012-11-09 DIAGNOSIS — R4587 Impulsiveness: Secondary | ICD-10-CM | POA: Diagnosis not present

## 2012-11-09 DIAGNOSIS — I6350 Cerebral infarction due to unspecified occlusion or stenosis of unspecified cerebral artery: Secondary | ICD-10-CM | POA: Diagnosis not present

## 2012-11-09 DIAGNOSIS — Z8546 Personal history of malignant neoplasm of prostate: Secondary | ICD-10-CM | POA: Diagnosis not present

## 2012-11-09 DIAGNOSIS — R3915 Urgency of urination: Secondary | ICD-10-CM | POA: Diagnosis not present

## 2012-11-09 DIAGNOSIS — I69998 Other sequelae following unspecified cerebrovascular disease: Secondary | ICD-10-CM | POA: Diagnosis not present

## 2012-11-09 DIAGNOSIS — Z5189 Encounter for other specified aftercare: Secondary | ICD-10-CM | POA: Diagnosis not present

## 2012-11-14 ENCOUNTER — Telehealth: Payer: Self-pay

## 2012-11-14 DIAGNOSIS — F988 Other specified behavioral and emotional disorders with onset usually occurring in childhood and adolescence: Secondary | ICD-10-CM

## 2012-11-14 NOTE — Telephone Encounter (Signed)
PT STATES HE NEED A WRITTEN PRESCRIPTION FOR SALT TABS??? PLEASE CALL [phone]

## 2012-11-16 MED ORDER — AMPHETAMINE-DEXTROAMPHET ER 25 MG PO CP24: 25.0000 mg | ORAL_CAPSULE | ORAL | Status: DC

## 2012-11-16 NOTE — Telephone Encounter (Signed)
He is asking for Adderall please advise. pended

## 2012-11-16 NOTE — Telephone Encounter (Signed)
Patient advised ready for pick up

## 2012-11-16 NOTE — Telephone Encounter (Signed)
???  not something I prescribe

## 2012-11-16 NOTE — Telephone Encounter (Signed)
Meds ordered this encounter  Medications  . amphetamine-dextroamphetamine (ADDERALL XR) 25 MG 24 hr capsule    Sig: Take 1 capsule (25 mg total) by mouth every morning. 01/15/13    Dispense:  30 capsule    Refill:  0  . amphetamine-dextroamphetamine (ADDERALL XR) 25 MG 24 hr capsule    Sig: Take 1 capsule (25 mg total) by mouth every morning. After 12/16/12    Dispense:  30 capsule    Refill:  0  . amphetamine-dextroamphetamine (ADDERALL XR) 25 MG 24 hr capsule    Sig: Take 1 capsule (25 mg total) by mouth every morning.    Dispense:  30 capsule    Refill:  0

## 2012-11-20 DIAGNOSIS — R269 Unspecified abnormalities of gait and mobility: Secondary | ICD-10-CM | POA: Diagnosis not present

## 2012-11-20 DIAGNOSIS — R279 Unspecified lack of coordination: Secondary | ICD-10-CM | POA: Diagnosis not present

## 2012-11-20 DIAGNOSIS — IMO0001 Reserved for inherently not codable concepts without codable children: Secondary | ICD-10-CM | POA: Diagnosis not present

## 2012-11-20 DIAGNOSIS — I6350 Cerebral infarction due to unspecified occlusion or stenosis of unspecified cerebral artery: Secondary | ICD-10-CM | POA: Diagnosis not present

## 2012-11-22 DIAGNOSIS — I6350 Cerebral infarction due to unspecified occlusion or stenosis of unspecified cerebral artery: Secondary | ICD-10-CM | POA: Diagnosis not present

## 2012-11-22 DIAGNOSIS — F09 Unspecified mental disorder due to known physiological condition: Secondary | ICD-10-CM | POA: Diagnosis not present

## 2012-11-24 DIAGNOSIS — R5381 Other malaise: Secondary | ICD-10-CM | POA: Diagnosis not present

## 2012-11-24 DIAGNOSIS — I69922 Dysarthria following unspecified cerebrovascular disease: Secondary | ICD-10-CM | POA: Diagnosis not present

## 2012-11-24 DIAGNOSIS — I69959 Hemiplegia and hemiparesis following unspecified cerebrovascular disease affecting unspecified side: Secondary | ICD-10-CM | POA: Diagnosis not present

## 2012-11-24 DIAGNOSIS — Z5189 Encounter for other specified aftercare: Secondary | ICD-10-CM | POA: Diagnosis not present

## 2012-11-24 DIAGNOSIS — I10 Essential (primary) hypertension: Secondary | ICD-10-CM | POA: Diagnosis not present

## 2012-11-26 ENCOUNTER — Telehealth: Payer: Self-pay

## 2012-11-26 DIAGNOSIS — Z5189 Encounter for other specified aftercare: Secondary | ICD-10-CM | POA: Diagnosis not present

## 2012-11-26 DIAGNOSIS — I69922 Dysarthria following unspecified cerebrovascular disease: Secondary | ICD-10-CM | POA: Diagnosis not present

## 2012-11-26 DIAGNOSIS — R5381 Other malaise: Secondary | ICD-10-CM | POA: Diagnosis not present

## 2012-11-26 DIAGNOSIS — I69959 Hemiplegia and hemiparesis following unspecified cerebrovascular disease affecting unspecified side: Secondary | ICD-10-CM | POA: Diagnosis not present

## 2012-11-26 DIAGNOSIS — I10 Essential (primary) hypertension: Secondary | ICD-10-CM | POA: Diagnosis not present

## 2012-11-26 NOTE — Telephone Encounter (Signed)
Dr Merla Riches, PT, [facility] S[facility], called to report that he saw pt on Saturday and is planning to work w/him 2 x wk for 4 weeks to build strength and ability to do the things that pt wants to do. If you have any questions, you can call him at [phone].

## 2012-11-27 ENCOUNTER — Telehealth: Payer: Self-pay

## 2012-11-27 DIAGNOSIS — I10 Essential (primary) hypertension: Secondary | ICD-10-CM | POA: Diagnosis not present

## 2012-11-27 DIAGNOSIS — R5381 Other malaise: Secondary | ICD-10-CM | POA: Diagnosis not present

## 2012-11-27 DIAGNOSIS — Z5189 Encounter for other specified aftercare: Secondary | ICD-10-CM | POA: Diagnosis not present

## 2012-11-27 DIAGNOSIS — I69959 Hemiplegia and hemiparesis following unspecified cerebrovascular disease affecting unspecified side: Secondary | ICD-10-CM | POA: Diagnosis not present

## 2012-11-27 DIAGNOSIS — I69922 Dysarthria following unspecified cerebrovascular disease: Secondary | ICD-10-CM | POA: Diagnosis not present

## 2012-11-27 NOTE — Telephone Encounter (Signed)
Okey Regal, ST from [facility] called to ask for a VO for ST two times weekly for 2 wks to treat dysarthria. Dr Merla Riches authorized the ST visits and I called Okey Regal back to give VO. She reported that pt had suffered a CVA and has just a very slight problem articulating. He wanted to work on it to improve, but will not require much therapy. Dr Merla Riches, [place] Childes

## 2012-11-28 ENCOUNTER — Encounter: Payer: Self-pay | Admitting: Gastroenterology

## 2012-11-28 ENCOUNTER — Telehealth: Payer: Self-pay | Admitting: Internal Medicine

## 2012-11-28 DIAGNOSIS — I10 Essential (primary) hypertension: Secondary | ICD-10-CM | POA: Diagnosis not present

## 2012-11-28 DIAGNOSIS — R5381 Other malaise: Secondary | ICD-10-CM | POA: Diagnosis not present

## 2012-11-28 DIAGNOSIS — I69959 Hemiplegia and hemiparesis following unspecified cerebrovascular disease affecting unspecified side: Secondary | ICD-10-CM | POA: Diagnosis not present

## 2012-11-28 DIAGNOSIS — Z5189 Encounter for other specified aftercare: Secondary | ICD-10-CM | POA: Diagnosis not present

## 2012-11-28 DIAGNOSIS — I69922 Dysarthria following unspecified cerebrovascular disease: Secondary | ICD-10-CM | POA: Diagnosis not present

## 2012-11-28 NOTE — Telephone Encounter (Signed)
PT WANTS TO KNOW IF HE COULD BE WORKED INTO YOUR APPT SCHEDULE NEXT WEEK AND IF YOU HAVE RECEIVED THE MEDICAL RECORDS FROM [facility]. ADVISED HIM OF YOUR WALK IN SCHEDULE, BUT HE WOULD LIKE AN APPT. HE RECENTLY HAS HAD A MILD STROKE.

## 2012-11-28 NOTE — Telephone Encounter (Signed)
Could add at 0930 12/06/12

## 2012-11-29 NOTE — Telephone Encounter (Signed)
Pt will be here next Wednesday at 9:30.

## 2012-11-30 ENCOUNTER — Other Ambulatory Visit: Payer: Self-pay | Admitting: Family Medicine

## 2012-11-30 DIAGNOSIS — Z5189 Encounter for other specified aftercare: Secondary | ICD-10-CM | POA: Diagnosis not present

## 2012-11-30 DIAGNOSIS — R5381 Other malaise: Secondary | ICD-10-CM | POA: Diagnosis not present

## 2012-11-30 DIAGNOSIS — I69959 Hemiplegia and hemiparesis following unspecified cerebrovascular disease affecting unspecified side: Secondary | ICD-10-CM | POA: Diagnosis not present

## 2012-11-30 DIAGNOSIS — I69922 Dysarthria following unspecified cerebrovascular disease: Secondary | ICD-10-CM | POA: Diagnosis not present

## 2012-11-30 DIAGNOSIS — I10 Essential (primary) hypertension: Secondary | ICD-10-CM | POA: Diagnosis not present

## 2012-12-04 ENCOUNTER — Other Ambulatory Visit: Payer: Self-pay

## 2012-12-04 DIAGNOSIS — I69959 Hemiplegia and hemiparesis following unspecified cerebrovascular disease affecting unspecified side: Secondary | ICD-10-CM | POA: Diagnosis not present

## 2012-12-04 DIAGNOSIS — R5381 Other malaise: Secondary | ICD-10-CM | POA: Diagnosis not present

## 2012-12-04 DIAGNOSIS — C61 Malignant neoplasm of prostate: Secondary | ICD-10-CM | POA: Diagnosis not present

## 2012-12-04 DIAGNOSIS — I10 Essential (primary) hypertension: Secondary | ICD-10-CM | POA: Diagnosis not present

## 2012-12-04 DIAGNOSIS — I69922 Dysarthria following unspecified cerebrovascular disease: Secondary | ICD-10-CM | POA: Diagnosis not present

## 2012-12-04 DIAGNOSIS — Z5189 Encounter for other specified aftercare: Secondary | ICD-10-CM | POA: Diagnosis not present

## 2012-12-04 MED ORDER — HYDROCHLOROTHIAZIDE 12.5 MG PO TABS: 12.5000 mg | ORAL_TABLET | Freq: Every day | ORAL | Status: DC

## 2012-12-04 NOTE — Telephone Encounter (Signed)
Pharm reqs RF of HCTZ. Pt is scheduled for OV 12/05/12. I will give him 1 mos RF

## 2012-12-05 ENCOUNTER — Encounter: Payer: Self-pay | Admitting: Internal Medicine

## 2012-12-05 ENCOUNTER — Ambulatory Visit (INDEPENDENT_AMBULATORY_CARE_PROVIDER_SITE_OTHER): Payer: Medicare Other | Admitting: Internal Medicine

## 2012-12-05 VITALS — BP 118/80 | HR 77 | Temp 98.0°F | Resp 20 | Ht 68.0 in | Wt 223.6 lb

## 2012-12-05 DIAGNOSIS — I6350 Cerebral infarction due to unspecified occlusion or stenosis of unspecified cerebral artery: Secondary | ICD-10-CM

## 2012-12-05 DIAGNOSIS — E8881 Metabolic syndrome: Secondary | ICD-10-CM

## 2012-12-05 DIAGNOSIS — E782 Mixed hyperlipidemia: Secondary | ICD-10-CM | POA: Diagnosis not present

## 2012-12-05 DIAGNOSIS — Z8546 Personal history of malignant neoplasm of prostate: Secondary | ICD-10-CM

## 2012-12-05 DIAGNOSIS — I10 Essential (primary) hypertension: Secondary | ICD-10-CM

## 2012-12-05 DIAGNOSIS — I639 Cerebral infarction, unspecified: Secondary | ICD-10-CM | POA: Insufficient documentation

## 2012-12-05 DIAGNOSIS — F909 Attention-deficit hyperactivity disorder, unspecified type: Secondary | ICD-10-CM | POA: Diagnosis not present

## 2012-12-05 DIAGNOSIS — N529 Male erectile dysfunction, unspecified: Secondary | ICD-10-CM

## 2012-12-05 DIAGNOSIS — Z86010 Personal history of colonic polyps: Secondary | ICD-10-CM

## 2012-12-05 MED ORDER — AMLODIPINE BESYLATE 5 MG PO TABS: 5.0000 mg | ORAL_TABLET | Freq: Every day | ORAL | Status: DC

## 2012-12-05 MED ORDER — TRAZODONE HCL 50 MG PO TABS: 25.0000 mg | ORAL_TABLET | Freq: Every evening | ORAL | Status: DC | PRN

## 2012-12-05 MED ORDER — HYDROCHLOROTHIAZIDE 12.5 MG PO TABS: 12.5000 mg | ORAL_TABLET | Freq: Every day | ORAL | Status: DC

## 2012-12-05 MED ORDER — BENAZEPRIL HCL 40 MG PO TABS: 40.0000 mg | ORAL_TABLET | Freq: Every day | ORAL | Status: DC

## 2012-12-05 MED ORDER — ATORVASTATIN CALCIUM 40 MG PO TABS: 40.0000 mg | ORAL_TABLET | Freq: Every day | ORAL | Status: DC

## 2012-12-05 NOTE — Progress Notes (Signed)
Subjective:    Patient ID: Gary Parrish, male    DOB: Aug 06, 1940, 72 y.o.   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/06/12 at [facility] resid Lsided weakness and artic probs being addressed by PT Still under 24h surv/driv restr but feels he has improved and GF corroborates Doing well overall/fatigue improving 80% stren acc to PT 12 lb wt loss via heart healthy diet Sleeping well w/ trazadone Neuro has no prob contin Adderall///was started on lipitor and ASA Has driven successfully once  Other prob  PROSTATE CANCER, HX OF---urol f/u this mo  METABOLIC SYNDROME X  HYPERLIPIDEMIA  HTN (hypertension)-BP stable thruout  COLONIC POLYPS, HX OF--gi f/u this mo  ADHD (attention deficit hyperactivity disorder)-still sees great benefit w/ meds Will consider semi-retir post cva-  ED-viagra in past/wants to resume   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    Review of Systems CV-neg neuro-stable since d/c No HA, vis chg Eating well      Objective:   Physical Exam BP 118/80  Pulse 77  Temp(Src) 98 F (36.7 C) (Oral)  Resp 20  Ht 5\' 8"  (1.727 m)  Wt 223 lb 9.6 oz (101.424 kg)  BMI 34.01 kg/m2  SpO2 95% HEENT clear Ht reg w/out M No bruits Grips almost equal Gait w/ cane good refl symm upper and lower goos str w/ toe up/shin push    Outside info 11/22/12-nueropsy eval SUMMARY OF RESULTS AND IMPRESSIONS:  The current neuropsychological test results are characterized by largely normal cognitive functioning for this 72 year old man with 13 years of education. Performance on measures of memory, basic attention and working memory, verbal and visual reasoning, and naming and word fluency are all average-to-above average. In contrast, psychomotor processing speed is mildly slowed, and there is also evidence of mild executive dysfunction on some measures of novel problem solving and reasoning abilities. Nonetheless, neither the patient nor his  son acknowledge significant changes or difficulties with disinhibition or executive dysfunction on self-report inventories. There is no evidence to suggest the presence of adjustment difficulties, or more longstanding difficulties with clinically relevant mood or anxiety disorders that may impact recovery. Encouragingly, results represent mild deficits, but they are nonetheless important to consider in the course of his recovery.  Gary Parrish appears to have been compliant with recommendations from his treatment team, and appears devoted to continuing to actively participate in therapies to further his recovery. Speech-language therapies could focus on his residual speech symptoms, as well as higher order executive tasks, if deemed appropriate. I encouraged him to continue to follow the recommendations of his treatment team, including to delegate higher-level decision making and some daily activities to his colleagues and employees for the meantime. I also encouraged him to continue to receive assistance with daily tasks that may pose a risk from a cognitive standpoint, including managing finances, medications, and cooking.  Repeat evaluation can be conducted at a more distal point in his recovery (6-12 months) to evaluate cognition and emotional/adjustment difficulties, if clinically indicated.  RECOMMENDATIONS:  1. I encouraged him to adhere to recommendations from his treatment team to further his cognitive and physical recovery, including receiving assistance with daily tasks such as driving and medication management. We also spoke about the importance of delegating some of his work-related tasks to employees/colleagues during his medical leave and recovery. We spoke about the possibility for increased fatigue and decreased energy following his stroke, and the recommendation to pace himself while engaging in activities.  2. Speech-language therapies could focus on his residual speech symptoms/dysarthria, as  well as higher order executive tasks, if deemed appropriate.  3. Repeat evaluation can be conducted at a more distal point in his recovery (6-12 months) to evaluate cognition and emotional/adjustment difficulties, if clinically indicated.  Preliminary results and recommendations were discussed with the patient and his son via telephone on November 26, 2012.  Thank you for allowing me to participate in the care of this patient. Please feel free to contact me at [phone] with questions.  Sincerely,  Yehuda Savannah. 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 traZODone (DESYREL) 50 MG tablet    Sig: Take 0.5-1 tablets (25-50 mg total) by mouth at bedtime as needed for sleep.    Dispense:  30 tablet    Refill:  3  . benazepril (LOTENSIN) 40 MG tablet    Sig: Take 1 tablet (40 mg total) by mouth daily.    Dispense:  90 tablet    Refill:  3    Order Specific Question:  Supervising Provider    Answer:  Darwin Rothlisberger P [[id number]]  . amLODipine (NORVASC) 5 MG tablet    Sig: Take 1 tablet (5 mg total) by mouth daily.    Dispense:  90 tablet    Refill:  3  . atorvastatin (LIPITOR) 40 MG tablet    Sig: Take 1 tablet (40 mg total) by mouth daily.    Dispense:  90 tablet    Refill:  3  . hydrochlorothiazide (HYDRODIURIL) 12.5 MG tablet    Sig: Take 1 tablet (12.5 mg  total) by mouth daily.    Dispense:  90 tablet    Refill:  3   Has other meds/f/u august  Addendum;D/C fr wake=Gary Parrish is a 72 y.o. male with PMHx of HTN, prostate cancer s/p radiation in 2009 admitted to inpatient rehab from OSH with functional deficits secondary to a right pontine stroke. His main impairments were numbness and weakness in his left hand and leg, dysarthria and cognitive impairments especially executive functioning. He remained stable during rehabilitation course and was continued on ASA 325 and lipitor for stroke ppx. His blood pressure was fairly controlled on HCTZ/benazepril/amlodipine. He demonstrated poor insight into his impairments and he and the family has been counseled on many occasions about his limitations due to cognitive deficits especially in performing his professional tasks. Caregivers were educated on patient's functional status and supervision is recommended at all times. He and the family prefers to establish care with neurologist at [facility] and a gold card appointment has been requested. He would be further assessed by a Neuropsychologist for detailed evaluation, as patient intends to resume his work.  He required continuous medical supervision post recent pontine infarction for further neurological monitoring, and close management of his hypertension due to risks of hemorrhagic conversion in setting of microvascular disease.  He required inpatient setting for further assessment of neurogenic bladder and bowel especially in setting of prostatic cancer. His risks of aspiration rendered a need of a swallowing assessment. Needed 3 hours/day of PT/OT/SLP for L sided weakness and dysarthria to increase functional independence with ADLs, communication, and mobility    To see them 6/12  office evaluation

## 2012-12-06 DIAGNOSIS — I6350 Cerebral infarction due to unspecified occlusion or stenosis of unspecified cerebral artery: Secondary | ICD-10-CM | POA: Diagnosis not present

## 2012-12-07 DIAGNOSIS — I69959 Hemiplegia and hemiparesis following unspecified cerebrovascular disease affecting unspecified side: Secondary | ICD-10-CM | POA: Diagnosis not present

## 2012-12-07 DIAGNOSIS — R5381 Other malaise: Secondary | ICD-10-CM | POA: Diagnosis not present

## 2012-12-07 DIAGNOSIS — I69922 Dysarthria following unspecified cerebrovascular disease: Secondary | ICD-10-CM | POA: Diagnosis not present

## 2012-12-07 DIAGNOSIS — I10 Essential (primary) hypertension: Secondary | ICD-10-CM | POA: Diagnosis not present

## 2012-12-07 DIAGNOSIS — Z5189 Encounter for other specified aftercare: Secondary | ICD-10-CM | POA: Diagnosis not present

## 2012-12-12 DIAGNOSIS — I10 Essential (primary) hypertension: Secondary | ICD-10-CM | POA: Diagnosis not present

## 2012-12-12 DIAGNOSIS — I69959 Hemiplegia and hemiparesis following unspecified cerebrovascular disease affecting unspecified side: Secondary | ICD-10-CM | POA: Diagnosis not present

## 2012-12-12 DIAGNOSIS — R5381 Other malaise: Secondary | ICD-10-CM | POA: Diagnosis not present

## 2012-12-12 DIAGNOSIS — Z5189 Encounter for other specified aftercare: Secondary | ICD-10-CM | POA: Diagnosis not present

## 2012-12-12 DIAGNOSIS — I69922 Dysarthria following unspecified cerebrovascular disease: Secondary | ICD-10-CM | POA: Diagnosis not present

## 2012-12-13 DIAGNOSIS — C61 Malignant neoplasm of prostate: Secondary | ICD-10-CM | POA: Diagnosis not present

## 2012-12-19 ENCOUNTER — Ambulatory Visit (INDEPENDENT_AMBULATORY_CARE_PROVIDER_SITE_OTHER): Payer: Medicare Other | Admitting: Gastroenterology

## 2012-12-19 ENCOUNTER — Encounter: Payer: Self-pay | Admitting: Gastroenterology

## 2012-12-19 VITALS — BP 106/70 | HR 88 | Ht 68.0 in | Wt 222.0 lb

## 2012-12-19 DIAGNOSIS — K219 Gastro-esophageal reflux disease without esophagitis: Secondary | ICD-10-CM

## 2012-12-19 DIAGNOSIS — Z1211 Encounter for screening for malignant neoplasm of colon: Secondary | ICD-10-CM

## 2012-12-19 DIAGNOSIS — D175 Benign lipomatous neoplasm of intra-abdominal organs: Secondary | ICD-10-CM

## 2012-12-19 NOTE — Progress Notes (Signed)
History of Present Illness: This is a 72 year old male underwent a colonoscopy in January 2008 which revealed internal and external hemorrhoids diverticulosis and 2 small colon polyps. Pathology showed the polyps to be small lipomas. Adenomatous polyps were not identified. His reflux symptoms are well-controlled on omeprazole 10 mg daily and antireflux measures. He was recently hospitalized for CVA at [facility] in [place]. Denies weight loss, abdominal pain, constipation, diarrhea, change in stool caliber, melena, hematochezia, nausea, vomiting, dysphagia, reflux symptoms, chest pain.  Review of Systems: Pertinent positive and negative review of systems were noted in the above HPI section. All other review of systems were otherwise negative.  Current Medications, Allergies, Past Medical History, Past Surgical History, Family History and Social History were reviewed in [employer] record.  Physical Exam: General: Well developed , well nourished, no acute distress Head: Normocephalic and atraumatic Eyes:  sclerae anicteric, EOMI Ears: Normal auditory acuity Mouth: No deformity or lesions Neck: Supple, no masses or thyromegaly Lungs: Clear throughout to auscultation Heart: Regular rate and rhythm; no murmurs, rubs or bruits Abdomen: Soft, non tender and non distended. No masses, hepatosplenomegaly or hernias noted. Normal Bowel sounds Musculoskeletal: Symmetrical with no gross deformities  Skin: No lesions on visible extremities Pulses:  Normal pulses noted Extremities: No clubbing, cyanosis, edema or deformities noted Neurological: Alert oriented x 4, grossly nonfocal Cervical Nodes:  No significant cervical adenopathy Inguinal Nodes: No significant inguinal adenopathy Psychological:  Alert and cooperative. Normal mood and affect  Assessment and Recommendations:  1.  GERD. Symptoms under excellent control. Continue antireflux measures and omeprazole daily.  2.  Colorectal cancer screening. Average risk. He has a history of small colon lipomas. No history of adenomatous colon polyps. Screening colonoscopy at 10 years in 06/2016.

## 2012-12-19 NOTE — Patient Instructions (Addendum)
We have printed your Colonoscopy report and 10 year recall letter for your insurance company.   Follow up as needed.  Thank you for choosing me and [facility].  Venita Lick. Pleas Koch., MD., Clementeen Graham

## 2012-12-21 DIAGNOSIS — I69922 Dysarthria following unspecified cerebrovascular disease: Secondary | ICD-10-CM | POA: Diagnosis not present

## 2012-12-21 DIAGNOSIS — R5381 Other malaise: Secondary | ICD-10-CM | POA: Diagnosis not present

## 2012-12-21 DIAGNOSIS — I10 Essential (primary) hypertension: Secondary | ICD-10-CM | POA: Diagnosis not present

## 2012-12-21 DIAGNOSIS — I69959 Hemiplegia and hemiparesis following unspecified cerebrovascular disease affecting unspecified side: Secondary | ICD-10-CM | POA: Diagnosis not present

## 2012-12-21 DIAGNOSIS — Z5189 Encounter for other specified aftercare: Secondary | ICD-10-CM | POA: Diagnosis not present

## 2012-12-26 DIAGNOSIS — I69919 Unspecified symptoms and signs involving cognitive functions following unspecified cerebrovascular disease: Secondary | ICD-10-CM | POA: Diagnosis not present

## 2012-12-26 DIAGNOSIS — I69959 Hemiplegia and hemiparesis following unspecified cerebrovascular disease affecting unspecified side: Secondary | ICD-10-CM | POA: Diagnosis not present

## 2012-12-26 DIAGNOSIS — I6350 Cerebral infarction due to unspecified occlusion or stenosis of unspecified cerebral artery: Secondary | ICD-10-CM | POA: Diagnosis not present

## 2013-01-14 ENCOUNTER — Other Ambulatory Visit: Payer: Self-pay | Admitting: Internal Medicine

## 2013-02-06 ENCOUNTER — Ambulatory Visit (INDEPENDENT_AMBULATORY_CARE_PROVIDER_SITE_OTHER): Payer: Medicare Other | Admitting: Internal Medicine

## 2013-02-06 ENCOUNTER — Encounter: Payer: Self-pay | Admitting: Internal Medicine

## 2013-02-06 VITALS — BP 106/66 | HR 86 | Temp 98.1°F | Resp 16 | Ht 68.0 in | Wt 217.0 lb

## 2013-02-06 DIAGNOSIS — I639 Cerebral infarction, unspecified: Secondary | ICD-10-CM

## 2013-02-06 DIAGNOSIS — I10 Essential (primary) hypertension: Secondary | ICD-10-CM

## 2013-02-06 DIAGNOSIS — I6350 Cerebral infarction due to unspecified occlusion or stenosis of unspecified cerebral artery: Secondary | ICD-10-CM

## 2013-02-06 DIAGNOSIS — F988 Other specified behavioral and emotional disorders with onset usually occurring in childhood and adolescence: Secondary | ICD-10-CM | POA: Diagnosis not present

## 2013-02-06 MED ORDER — AMPHETAMINE-DEXTROAMPHETAMINE 15 MG PO TABS: 15.0000 mg | ORAL_TABLET | Freq: Two times a day (BID) | ORAL | Status: DC

## 2013-02-06 NOTE — Progress Notes (Signed)
  Subjective:    Patient ID: Gary Parrish, male    DOB: 07/14/1940, 72 y.o.   MRN: [id number]  HPI followup for attention deficit disorder, hypertension, and recent stroke  Patient continues to follow heart health diet and has lost 6 pounds. Blood pressure decreased and he is interested in discontinuing meds as possible. Interested in decreasing Adderall and possibly taking twice a day. Short-acting rather than long-acting for more personal control. Patient feels like rehab not progressing as quickly as in the beginning. Considering increasing exercise to 3x week from 2x week. Continues to have decreased energy. Patient's companion reports that patient limping on left leg. Patient unaware. Does acknowledge continued left-sided weakness particularly of the left leg. Articulation much improved. Becoming more active. drives without any problems.    Current outpatient prescriptions:amLODipine (NORVASC) 5 MG tablet, Take 1 tablet (5 mg total) by mouth daily. atorvastatin (LIPITOR) 40 MG tablet, Take 1 tablet (40 mg total) by mouth daily., Disp: 90 tablet, Rfl: 3;   benazepril (LOTENSIN) 40 MG tablet, Take 1 tablet (40 mg total) by mouth daily., Disp: 90 tablet, Rfl: 3 FIBER PO, Take 1 tablet by mouth daily.  , Disp: , Rfl: ;   hydrochlorothiazide (HYDRODIURIL) 12.5 MG tablet, Take 1 tablet (12.5 mg total) by mouth daily., Disp: 90 tablet, Rfl: 3;  omeprazole (PRILOSEC) 10 MG capsule, Take 10 mg by mouth daily., Disp: , Rfl: ;  OVER THE COUNTER MEDICATION, OTC Vitamin C 250 mg bid, Disp: , Rfl:  sildenafil (VIAGRA) 100 MG tablet, Take 1 tablet (100 mg total) by mouth daily as needed., Disp: 30 tablet, Rfl: 0;   traZODone (DESYREL) 50 MG tablet, TAKE 1/2 TO 1 TABLET BY MOUTH AT BEDTIME AS NEEDED FOR SLEEP, Disp: 90 tablet, Rfl: 0;  adderall 25xr   Review of Systems No headaches or vision changes No weight loss No chest pain or palpitations No paresthesias Appetite good No GI or GU. symptoms     Objective:   Physical Exam BP 106/66  Pulse 86  Temp(Src) 98.1 F (36.7 C) (Oral)  Resp 16  Ht 5\' 8"  (1.727 m)  Wt 217 lb (98.431 kg)  BMI 33 kg/m2  SpO2 97% HEENT clear Gait exhibits left leg weakness Heart regular without murmur No carotid bruits       Assessment & Plan:  Attention deficit disorder without mention of hyperactivity--changed to 15 mg twice a day of regular release  CVA (cerebral vascular accident)-encouraged to continue aggressive rehabilitation with strength building through a personal trainer  HTN (hypertension)--- will discontinue Norvasc  Meds ordered this encounter  Medications  . amphetamine-dextroamphetamine (ADDERALL) 15 MG tablet    Sig: Take 1 tablet (15 mg total) by mouth 2 (two) times daily.    Dispense:  60 tablet    Refill:  0  . amphetamine-dextroamphetamine (ADDERALL) 15 MG tablet    Sig: Take 1 tablet (15 mg total) by mouth 2 (two) times daily.    Dispense:  60 tablet    Refill:  0  . amphetamine-dextroamphetamine (ADDERALL) 15 MG tablet    Sig: Take 1 tablet (15 mg total) by mouth 2 (two) times daily. 04/08/13    Dispense:  60 tablet    Refill:  0   Followup 3 months

## 2013-02-27 ENCOUNTER — Telehealth: Payer: Self-pay

## 2013-02-27 NOTE — Telephone Encounter (Signed)
PA was denied for coverage of Cialis for pt even at lower quantity per month. Notified pt and advised that we do not have a coupon here for it, but he may be able to find one online. Dr Merla Riches, pt wondered if he might have low testosterone that could be contributing to ED. I don't see that we have tested for it previously. Please advise if this would be appropriate for pt and if you want to order lab only?

## 2013-02-28 NOTE — Telephone Encounter (Signed)
Because of hx of prostate Ca he may not be candidate for testosterone replacement even if testos low--so he needs to discuss this with his urologist and be tested there

## 2013-02-28 NOTE — Telephone Encounter (Signed)
Advised pt to check with urologist about testosterone test and possible medication if needed. Pt agreed.

## 2013-04-10 DIAGNOSIS — H353190 Nonexudative age-related macular degeneration, unspecified eye, stage unspecified: Secondary | ICD-10-CM | POA: Diagnosis not present

## 2013-04-10 DIAGNOSIS — H35379 Puckering of macula, unspecified eye: Secondary | ICD-10-CM | POA: Diagnosis not present

## 2013-04-24 ENCOUNTER — Ambulatory Visit (INDEPENDENT_AMBULATORY_CARE_PROVIDER_SITE_OTHER): Payer: Medicare Other | Admitting: Internal Medicine

## 2013-04-24 ENCOUNTER — Encounter: Payer: Self-pay | Admitting: Internal Medicine

## 2013-04-24 VITALS — BP 140/86 | HR 72 | Temp 98.1°F | Resp 16 | Ht 68.0 in | Wt 222.8 lb

## 2013-04-24 DIAGNOSIS — Z23 Encounter for immunization: Secondary | ICD-10-CM | POA: Diagnosis not present

## 2013-04-24 DIAGNOSIS — N3941 Urge incontinence: Secondary | ICD-10-CM

## 2013-04-24 DIAGNOSIS — F988 Other specified behavioral and emotional disorders with onset usually occurring in childhood and adolescence: Secondary | ICD-10-CM | POA: Diagnosis not present

## 2013-04-24 DIAGNOSIS — N529 Male erectile dysfunction, unspecified: Secondary | ICD-10-CM

## 2013-04-24 DIAGNOSIS — I10 Essential (primary) hypertension: Secondary | ICD-10-CM | POA: Diagnosis not present

## 2013-04-24 MED ORDER — AMPHETAMINE-DEXTROAMPHETAMINE 15 MG PO TABS: 15.0000 mg | ORAL_TABLET | Freq: Two times a day (BID) | ORAL | Status: DC

## 2013-04-24 MED ORDER — OXYBUTYNIN CHLORIDE ER 5 MG PO TB24: 5.0000 mg | ORAL_TABLET | Freq: Every day | ORAL | Status: DC

## 2013-04-24 MED ORDER — TRAZODONE HCL 50 MG PO TABS: ORAL_TABLET | ORAL | Status: DC

## 2013-04-24 NOTE — Progress Notes (Signed)
Subjective:    Patient ID: Gary Parrish, male    DOB: [birth year], 72 y.o.   MRN: [id number]  HPI Patient is seen in follow up today. Checking BP at home 140s/80s, similar to reading here. Hasn't been able to exercise due to travel and is trying to increase walking and taking stairs. Wearing pedometer to track activity. Has gained 5 pounds since visit 02/06/13.  Has been doing well on Adderall at current dose.  No chest pain, no SOB, no swelling. Taking OTC meds for allergies. Has had 1 month history of itchy, red eyes with matting in the mornings. Had annual eye exam last month- normal.   Has history of urge incontinence. No dysuria, no hematuria. Has possibly tried something for this in past? Viagra working well. Saw urologist July 2014. PSA nml.   Review of Systems  HENT: Positive for congestion.   Eyes: Positive for discharge, redness and itching. Negative for photophobia, pain and visual disturbance.  Respiratory: Negative for shortness of breath.   Cardiovascular: Negative for chest pain and leg swelling.  Genitourinary: Negative for dysuria, frequency and difficulty urinating.       Urge incontinence.       Objective:   Physical Exam  Nursing note and vitals reviewed. Constitutional: He is oriented to person, place, and time. He appears well-developed and well-nourished. No distress.  HENT:  Right Ear: Tympanic membrane, external ear and ear canal normal.  Left Ear: Tympanic membrane, external ear and ear canal normal.  Nose: Nose normal.  Mouth/Throat: Oropharynx is clear and moist.  Eyes: Lids are normal. Right eye exhibits no discharge. Left eye exhibits no discharge. Right conjunctiva is injected. Left conjunctiva is injected. No scleral icterus.  Neck: Normal range of motion. Neck supple.  Cardiovascular: Normal rate and regular rhythm.   Pulmonary/Chest: Effort normal and breath sounds normal.  Musculoskeletal: Normal range of motion. He exhibits edema.  Trace tibial  edema  Neurological: He is alert and oriented to person, place, and time.  Skin: Skin is warm and dry. He is not diaphoretic.  Psychiatric: He has a normal mood and affect. His behavior is normal. Judgment and thought content normal.       Assessment & Plan:  Need for prophylactic vaccination and inoculation against influenza - Plan: Flu Vaccine QUAD 36+ mos IM  Attention deficit disorder without mention of hyperactivity  HTN (hypertension)  ED (erectile dysfunction)  Urge incontinence  Meds ordered this encounter  Medications  . traZODone (DESYREL) 50 MG tablet    Sig: TAKE 1/2 TO 1 TABLET BY MOUTH AT BEDTIME AS NEEDED FOR SLEEP    Dispense:  90 tablet    Refill:  3  . amphetamine-dextroamphetamine (ADDERALL) 15 MG tablet    Sig: Take 1 tablet (15 mg total) by mouth 2 (two) times daily.    Dispense:  60 tablet    Refill:  0  . amphetamine-dextroamphetamine (ADDERALL) 15 MG tablet    Sig: Take 1 tablet (15 mg total) by mouth 2 (two) times daily.    Dispense:  60 tablet    Refill:  0  . amphetamine-dextroamphetamine (ADDERALL) 15 MG tablet    Sig: Take 1 tablet (15 mg total) by mouth 2 (two) times daily.    Dispense:  60 tablet    Refill:  0  . oxybutynin (DITROPAN-XL) 5 MG 24 hr tablet    Sig: Take 1 tablet (5 mg total) by mouth daily.    Dispense:  30 tablet  Refill:  2   I have completed the patient encounter in its entirety as documented by FNP Leone Payor, with editing by me where necessary. Robert P. Merla Riches, M.D.

## 2013-04-30 ENCOUNTER — Telehealth: Payer: Self-pay

## 2013-04-30 MED ORDER — TRAZODONE HCL 100 MG PO TABS: 100.0000 mg | ORAL_TABLET | Freq: Every day | ORAL | Status: DC

## 2013-04-30 NOTE — Telephone Encounter (Signed)
Meds ordered this encounter  Medications  . traZODone (DESYREL) 100 MG tablet    Sig: Take 1 tablet (100 mg total) by mouth at bedtime.    Dispense:  90 tablet    Refill:  3    

## 2013-04-30 NOTE — Telephone Encounter (Signed)
Dr Merla Riches, I received fax from pharm stating that pt reports he is having to take 2 of the 50 mg Trazodone tabs Qhs in order to sleep. They would like to have a new Rx sent w/new sig and quantity. I have pended a new Rx for 100 mg tabs 1 tab Qhs for your review w/same # RFs you gave him on prev dose. I'm not sure why pt didn't discuss this when you were RFing this at his appt?

## 2013-05-01 ENCOUNTER — Ambulatory Visit: Payer: Medicare Other | Admitting: Internal Medicine

## 2013-06-06 DIAGNOSIS — H01029 Squamous blepharitis unspecified eye, unspecified eyelid: Secondary | ICD-10-CM | POA: Diagnosis not present

## 2013-06-24 ENCOUNTER — Other Ambulatory Visit: Payer: Self-pay

## 2013-06-24 MED ORDER — SILDENAFIL CITRATE 100 MG PO TABS: 100.0000 mg | ORAL_TABLET | Freq: Every day | ORAL | Status: DC | PRN

## 2013-06-24 NOTE — Telephone Encounter (Signed)
Meds ordered this encounter  Medications  . sildenafil (VIAGRA) 100 MG tablet    Sig: Take 1 tablet (100 mg total) by mouth daily as needed.    Dispense:  24 tablet    Refill:  3    FAX TO [email or web address] at [phone].    Order Specific Question:  Supervising Provider    Answer:  Ellamae Sia P [[id number]]

## 2013-06-24 NOTE — Telephone Encounter (Signed)
[email or web address] faxed req for Viagra 100 mg #8. I have pended for #24 (3 mos supply?) w/out RFs. Dr Merla Riches please review.

## 2013-06-27 HISTORY — PX: MENISCUS REPAIR: SHX5179

## 2013-07-01 DIAGNOSIS — H04129 Dry eye syndrome of unspecified lacrimal gland: Secondary | ICD-10-CM | POA: Diagnosis not present

## 2013-07-11 ENCOUNTER — Ambulatory Visit (INDEPENDENT_AMBULATORY_CARE_PROVIDER_SITE_OTHER): Payer: Medicare Other | Admitting: Family Medicine

## 2013-07-11 VITALS — BP 124/78 | HR 74 | Temp 98.3°F | Resp 18 | Ht 68.0 in | Wt 224.0 lb

## 2013-07-11 DIAGNOSIS — J209 Acute bronchitis, unspecified: Secondary | ICD-10-CM

## 2013-07-11 MED ORDER — HYDROCOD POLST-CHLORPHEN POLST 10-8 MG/5ML PO LQCR: 5.0000 mL | Freq: Two times a day (BID) | ORAL | Status: DC | PRN

## 2013-07-11 MED ORDER — AZITHROMYCIN 250 MG PO TABS: ORAL_TABLET | ORAL | Status: DC

## 2013-07-11 MED ORDER — OXYBUTYNIN CHLORIDE ER 5 MG PO TB24: 5.0000 mg | ORAL_TABLET | Freq: Every day | ORAL | Status: DC

## 2013-07-11 NOTE — Progress Notes (Signed)
Subjective:    Patient ID: Gary Parrish, male    DOB: January 12, 1941, 73 y.o.   MRN: [id number] Chief Complaint  Patient presents with  . Cough    x2 weeks   . Nasal Congestion  . rx refills    ditropan   HPI This chart was scribed for Harmon Pier Saw-MD by Celesta Gentile, Scribe. This patient was seen in room 3 and the patient's care was started at 9:06 AM.  HPI Comments: Gary Parrish is a 73 y.o. male who presents to the [facility] complaining of a persistent productive cough that started over 2 weeks ago with associated nasal congestion.  He states his cough is productive of clear phlegm and has had some blood with his nasal congestion.  Pt also complains of left maxillary sinus pressure and a scratchy throat.  Pt denies fever, chills, chest pain, and SOB.  Pt has tried OTC medication, including Theraflu and Mucinex with some congestion relief.  Pt reports that he is urinating normally and having normal bowel movements.  Pt denies smoking.  Feels like he is getting better - def URI sxs improving - but cough continues.  Pt also needs a refill of Ditropan.   Past Surgical History  Procedure Laterality Date  . Hemorrhoidal banding      Family History  Problem Relation Age of Onset  . Breast cancer Mother     History   Social History  . Marital Status: Married    Spouse Name: N/A    Number of Children: N/A  . Years of Education: N/A   Occupational History  . Not on file.   Social History Main Topics  . Smoking status: Never Smoker   . Smokeless tobacco: Never Used  . Alcohol Use: No     Comment: in the past but not current  . Drug Use: No  . Sexual Activity: Not on file   Other Topics Concern  . Not on file   Social History Narrative  . No narrative on file    No Known Allergies  Patient Active Problem List   Diagnosis Date Noted  . CVA (cerebral vascular accident) 12/05/2012  . ED (erectile dysfunction) 12/05/2012  . Attention deficit disorder  without mention of hyperactivity 03/10/2011  . HTN (hypertension) 03/10/2011  . FATIGUE 01/01/2010  . CHEST DISCOMFORT 01/01/2010  . HIP PAIN, RIGHT 02/11/2009  . KNEE PAIN, RIGHT 02/11/2009  . GERD 09/16/2008  . DIVERTICULOSIS, COLON 09/16/2008  . PROSTATE CANCER, HX OF 09/16/2008  . SKIN CANCER, HX OF 09/16/2008  . HYPERLIPIDEMIA 05/31/2007  . METABOLIC SYNDROME X 59/56/3875  . 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 UNS ADVRS EFF UNS RX MEDICINAL&BIOLOGICAL SBSTNC 05/31/2007  . COLONIC POLYPS, HX OF 07/21/2006    Review of Systems  Constitutional: Negative for fever, chills, diaphoresis, activity change and appetite change.  HENT: Positive for congestion, postnasal drip, rhinorrhea, sinus pressure and sore throat. Negative for ear pain, trouble swallowing and voice change.   Respiratory: Positive for cough. Negative for chest tightness and shortness of breath.   Cardiovascular: Negative for chest pain.  Gastrointestinal: Negative for nausea, vomiting, abdominal pain and diarrhea.  Genitourinary: Negative for dysuria.  Musculoskeletal: Negative for back pain and neck stiffness.  Skin: Negative for color change and rash.  Neurological: Negative for syncope.  Hematological: Negative for adenopathy.  Psychiatric/Behavioral: Positive for sleep disturbance.    Triage Vitals: BP 124/78  Pulse 74  Temp(Src) 98.3 F (36.8 C) (Oral)  Resp 18  Ht 5\' 8"  (1.727 m)  Wt 224 lb (101.606 kg)  BMI 34.07 kg/m2  SpO2 95%    Objective:   Physical Exam  Nursing note and vitals reviewed. Constitutional: He is oriented to person, place, and time. He appears well-developed and well-nourished. No distress.  HENT:  Head: Normocephalic and atraumatic.  Right Ear: External ear and ear canal normal. Tympanic membrane is injected and retracted.  Left Ear: External ear and ear canal normal. Tympanic membrane is retracted. A middle ear effusion is present.  Nose: Left sinus  exhibits maxillary sinus tenderness.  Mouth/Throat: Uvula is midline and mucous membranes are normal. No oropharyngeal exudate, posterior oropharyngeal edema or posterior oropharyngeal erythema.  Eyes: Conjunctivae and EOM are normal. Right eye exhibits no discharge. Left eye exhibits no discharge.  Neck: Trachea normal. Neck supple. No tracheal deviation present. No thyromegaly present.  Cardiovascular: Normal rate, regular rhythm, S1 normal, S2 normal and normal heart sounds.   No murmur heard. Pulmonary/Chest: Effort normal and breath sounds normal. No respiratory distress. He has no wheezes. He has no rales.  Musculoskeletal: Normal range of motion.  Lymphadenopathy:       Head (right side): No submandibular, no tonsillar, no preauricular, no posterior auricular and no occipital adenopathy present.       Head (left side): No submandibular, no tonsillar, no preauricular, no posterior auricular and no occipital adenopathy present.    He has no cervical adenopathy.       Right: No supraclavicular adenopathy present.       Left: No supraclavicular adenopathy present.  Neurological: He is alert and oriented to person, place, and time.  Skin: Skin is warm and dry.  Psychiatric: He has a normal mood and affect. His behavior is normal.       Assessment & Plan:  Acute bronchitis - suspect viral, pt is recovering well on expected course. counseled that cough may persist for an additional wk so treat symptomatically. [facility] rx given for zpack for pt to fill only if has a secondary worsening of sxs.  Meds ordered this encounter  Medications  . oxybutynin (DITROPAN-XL) 5 MG 24 hr tablet    Sig: Take 1 tablet (5 mg total) by mouth daily.    Dispense:  30 tablet    Refill:  5  . DISCONTD: azithromycin (ZITHROMAX) 250 MG tablet    Sig: Take 2 tabs PO x 1 dose, then 1 tab PO QD x 4 days    Dispense:  6 tablet    Refill:  0  . chlorpheniramine-HYDROcodone (TUSSIONEX PENNKINETIC ER) 10-8 MG/5ML LQCR      Sig: Take 5 mLs by mouth every 12 (twelve) hours as needed.    Dispense:  120 mL    Refill:  0  . azithromycin (ZITHROMAX) 250 MG tablet    Sig: Take 2 tabs PO x 1 dose, then 1 tab PO QD x 4 days    Dispense:  6 tablet    Refill:  0    I personally performed the services described in this documentation, which was scribed in my presence. The recorded information has been reviewed and considered, and addended by me as needed.  Delman Cheadle, MD MPH

## 2013-07-11 NOTE — Patient Instructions (Addendum)
You should stay away from all cough and cold medications containing decongestant, especially phenylephrine and pseudoephedrine (will be listed under the active ingredient list).  To make it easier, CoricidinHBP is a product tailored towards people with hypertension.  Acute Bronchitis Bronchitis is inflammation of the airways that extend from the windpipe into the lungs (bronchi). The inflammation often causes mucus to develop. This leads to a cough, which is the most common symptom of bronchitis.  In acute bronchitis, the condition usually develops suddenly and goes away over time, usually in a couple weeks. Smoking, allergies, and asthma can make bronchitis worse. Repeated episodes of bronchitis may cause further lung problems.  CAUSES Acute bronchitis is most often caused by the same virus that causes a cold. The virus can spread from person to person (contagious).  SIGNS AND SYMPTOMS   Cough.   Fever.   Coughing up mucus.   Body aches.   Chest congestion.   Chills.   Shortness of breath.   Sore throat.  DIAGNOSIS  Acute bronchitis is usually diagnosed through a physical exam. Tests, such as chest X-rays, are sometimes done to rule out other conditions.  TREATMENT  Acute bronchitis usually goes away in a couple weeks. Often times, no medical treatment is necessary. Medicines are sometimes given for relief of fever or cough. Antibiotics are usually not needed but may be prescribed in certain situations. In some cases, an inhaler may be recommended to help reduce shortness of breath and control the cough. A cool mist vaporizer may also be used to help thin bronchial secretions and make it easier to clear the chest.  HOME CARE INSTRUCTIONS  Get plenty of rest.   Drink enough fluids to keep your urine clear or pale yellow (unless you have a medical condition that requires fluid restriction). Increasing fluids may help thin your secretions and will prevent dehydration.   Only  take over-the-counter or prescription medicines as directed by your health care provider.   Avoid smoking and secondhand smoke. Exposure to cigarette smoke or irritating chemicals will make bronchitis worse. If you are a smoker, consider using nicotine gum or skin patches to help control withdrawal symptoms. Quitting smoking will help your lungs heal faster.   Reduce the chances of another bout of acute bronchitis by washing your hands frequently, avoiding people with cold symptoms, and trying not to touch your hands to your mouth, nose, or eyes.   Follow up with your health care provider as directed.  SEEK MEDICAL CARE IF: Your symptoms do not improve after 1 week of treatment.  SEEK IMMEDIATE MEDICAL CARE IF:  You develop an increased fever or chills.   You have chest pain.   You have severe shortness of breath.  You have bloody sputum.   You develop dehydration.  You develop fainting.  You develop repeated vomiting.  You develop a severe headache. MAKE SURE YOU:   Understand these instructions.  Will watch your condition.  Will get help right away if you are not doing well or get worse. Document Released: 07/21/2004 Document Revised: 02/13/2013 Document Reviewed: 12/04/2012 [facility] Patient Information 2014 [place].

## 2013-08-02 ENCOUNTER — Encounter: Payer: Self-pay | Admitting: Internal Medicine

## 2013-08-04 MED ORDER — AMPHETAMINE-DEXTROAMPHETAMINE 15 MG PO TABS: 15.0000 mg | ORAL_TABLET | Freq: Two times a day (BID) | ORAL | Status: DC

## 2013-08-04 NOTE — Telephone Encounter (Signed)
Meds ordered this encounter  Medications  . amphetamine-dextroamphetamine (ADDERALL) 15 MG tablet    Sig: Take 1 tablet (15 mg total) by mouth 2 (two) times daily. For 60d after signing    Dispense:  60 tablet    Refill:  0  . amphetamine-dextroamphetamine (ADDERALL) 15 MG tablet    Sig: Take 1 tablet (15 mg total) by mouth 2 (two) times daily.    Dispense:  60 tablet    Refill:  0  . amphetamine-dextroamphetamine (ADDERALL) 15 MG tablet    Sig: Take 1 tablet (15 mg total) by mouth 2 (two) times daily. For 30 d after signing    Dispense:  60 tablet    Refill:  0

## 2013-08-21 ENCOUNTER — Encounter: Payer: Self-pay | Admitting: Internal Medicine

## 2013-08-21 ENCOUNTER — Ambulatory Visit (INDEPENDENT_AMBULATORY_CARE_PROVIDER_SITE_OTHER): Payer: Medicare Other | Admitting: Internal Medicine

## 2013-08-21 VITALS — BP 133/91 | HR 69 | Temp 98.4°F | Resp 16 | Ht 67.75 in | Wt 222.6 lb

## 2013-08-21 DIAGNOSIS — N529 Male erectile dysfunction, unspecified: Secondary | ICD-10-CM | POA: Diagnosis not present

## 2013-08-21 DIAGNOSIS — I6350 Cerebral infarction due to unspecified occlusion or stenosis of unspecified cerebral artery: Secondary | ICD-10-CM | POA: Diagnosis not present

## 2013-08-21 DIAGNOSIS — I639 Cerebral infarction, unspecified: Secondary | ICD-10-CM

## 2013-08-21 DIAGNOSIS — F988 Other specified behavioral and emotional disorders with onset usually occurring in childhood and adolescence: Secondary | ICD-10-CM | POA: Diagnosis not present

## 2013-08-21 DIAGNOSIS — I10 Essential (primary) hypertension: Secondary | ICD-10-CM | POA: Diagnosis not present

## 2013-08-21 MED ORDER — FLUTICASONE PROPIONATE 50 MCG/ACT NA SUSP: NASAL | Status: DC

## 2013-08-21 NOTE — Progress Notes (Signed)
Subjective:    Patient ID: Gary Parrish, male    DOB: 05/25/1941, 73 y.o.   MRN: [id number] This chart was scribed for Leandrew Koyanagi, MD by Rolanda Lundborg, ED Scribe. This patient was seen in room 24 and the patient's care was started at 2:29 PM.  Chief Complaint  Patient presents with   3 month follow up    HPI HPI Comments: Gary Parrish is a 73 y.o. male with a h/o CVA, ADD, HTN, GERD, prostate cancer, skin cancer, hyperlipidemia who presents to the [facility] for a 3 month follow up. He states the Adderall is working well for him. Does not need a refill.  He reports intermittent weakness in the left knee. He states it pops sometimes but never swells. He states sometimes he walks with a limp. He denies hip pain. He completed the physical therapy for his left arm and is not having any more problems with his arm.   He is still having constant rhinorrhea and occasional cough since having bronchitis 6 weeks ago. He was treated with a Z pack on 1/15.   He states he has been taking 1.5 Viagra pills at a time because 1 pill is no longer working for him. He denies any side effects or problems with the increased dose.   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 For 60d after signing  60 tablet  0   amphetamine-dextroamphetamine (ADDERALL) 15 MG tablet Take 1 tablet (15 mg total) by mouth 2 (two) times daily.  60 tablet  0   amphetamine-dextroamphetamine (ADDERALL) 15 MG tablet Take 1 tablet (15 mg total) by mouth 2 (two) times daily. For 30 d after signing  60 tablet  0   aspirin 325 MG tablet Take 325 mg by mouth daily.       atorvastatin (LIPITOR) 40 MG tablet Take 1 tablet (40 mg total) by mouth daily.  90 tablet  3   benazepril (LOTENSIN) 40 MG tablet Take 1 tablet (40 mg total) by mouth daily.  90 tablet  3   FIBER PO Take 1 tablet by mouth daily.         glucosamine-chondroitin 500-400 MG tablet Take 1 tablet by mouth daily. Patient states he takes 1500 daily       hydrochlorothiazide (HYDRODIURIL) 12.5 MG tablet Take 1 tablet (12.5 mg total) by mouth daily.  90 tablet  3   Multiple Vitamins-Minerals (VISION VITAMINS PO) Take 1 tablet by mouth daily.         omeprazole (PRILOSEC) 10 MG capsule Take 10 mg  by mouth daily.       OVER THE COUNTER MEDICATION OTC Vitamin C 250 mg bid       oxybutynin (DITROPAN-XL) 5 MG 24 hr tablet Take 1 tablet (5 mg total) by mouth daily.  30 tablet  5   sildenafil (VIAGRA) 100 MG tablet Take 1 tablet (100 mg total) by mouth daily as needed.  24 tablet  3   traZODone (DESYREL) 100 MG tablet Take 1 tablet (100 mg total) by mouth at bedtime.  90 tablet  3   amLODipine (NORVASC) 5 MG tablet Take 1 tablet (5 mg total) by mouth daily.  90 tablet  3   No current facility-administered medications on file prior to visit.   History  Substance Use Topics   Smoking status: Never Smoker    Smokeless tobacco: Never Used   Alcohol Use: No     Comment: in the past but not current      Review of Systems    Constitutional: Negative for activity change, appetite change, fatigue and unexpected weight change.  HENT: Positive for rhinorrhea.   Eyes: Negative for visual disturbance.  Respiratory: Positive for cough.   Cardiovascular: Negative for chest pain, palpitations and leg swelling.  Neurological: Negative for dizziness, tremors, syncope, facial asymmetry, light-headedness and headaches.  Psychiatric/Behavioral: Negative for dysphoric mood.       Objective:   Physical Exam  Nursing note and vitals reviewed. Constitutional: He is oriented to person, place, and time. He appears well-developed and well-nourished. No distress.  HENT:  Head: Normocephalic and atraumatic.  Nose: Nose normal.  Eyes: Conjunctivae and EOM are normal. Pupils are equal, round, and reactive to light.  Neck: Neck supple. No thyromegaly present.  Cardiovascular: Normal rate, regular rhythm, normal heart sounds and intact distal pulses.   No murmur heard. Pulmonary/Chest: Effort normal and breath sounds normal. No respiratory distress.  Musculoskeletal: Normal range of motion.  Left knee is not swollen and there is no effusion. Non-tender to palpation. Stressors intact.  Quads atrophy and strn diff RvsL Gait with hip sublux to stabilize  Lymphadenopathy:    He has no cervical adenopathy.  Neurological: He is alert and oriented to person, place, and time. He has normal reflexes. No cranial nerve deficit. Coordination normal.  Skin: Skin is warm and dry.  Psychiatric: He has a normal mood and affect. His behavior is normal.    Filed Vitals:   08/21/13 1405  BP: 133/91  Pulse: 69  Temp: 98.4 F (36.9 C)  TempSrc: Oral  Resp: 16  Height: 5' 7.75" (1.721 m)  Weight: 222 lb 9.6 oz (100.971 kg)  SpO2: 96%    Wt Readings from Last 3 Encounters:  08/21/13 222 lb 9.6 oz (100.971 kg)  07/11/13 224 lb (101.606 kg)  04/24/13 222 lb 12.8 oz (101.061 kg)         Assessment & Plan:  Attention deficit disorder  without mention of hyperactivity  CVA (cerebral vascular accident)  ED (erectile dysfunction)  HTN (hypertension)  Persistent PND after infection  Knee pain w/ quads atr---to pt Meds ordered this encounter  Medications   fluticasone (FLONASE) 50 MCG/ACT nasal spray    Sig: 2 sprays each nostril at bedtime    Dispense:  16 g    Refill:  6   Patient Instructions  TO: PT  Has atrophy L quads with weakness of knee extension and "give way" with gait--L Hip  Moves laterally to stabilize gait Please evaluate and treat to strengthen  L leg     I have completed the patient encounter in its entirety as documented by the scribe, with editing by me where necessary. Robert P. Laney Pastor, M.D.

## 2013-08-21 NOTE — Patient Instructions (Signed)
TO: PT  Has atrophy L quads with weakness of knee extension and "give way" with gait--L Hip  Moves laterally to stabilize gait Please evaluate and treat to strengthen L leg

## 2013-08-27 ENCOUNTER — Telehealth: Payer: Self-pay | Admitting: *Deleted

## 2013-08-27 NOTE — Telephone Encounter (Signed)
Do handicap sticker to replace his current one---s/p CVA  W/ L sided weakness

## 2013-08-27 NOTE — Telephone Encounter (Signed)
[facility] Mychart After Visit Questionnaire    Question 08/27/2013 12:00 PM   How are you feeling after your recent visit? Good   Does the recommended course of treatment seem to be helping your symptoms? yes   Are you experiencing any side effects from your recommended treatment? no   is there anything else you would like to ask your physician? can get a new handy cap plackard   Can respond via mychart.

## 2013-08-30 NOTE — Telephone Encounter (Signed)
Dr. Laney Pastor, in your box. Just needs your signature please. Thanks

## 2013-09-02 NOTE — Telephone Encounter (Signed)
Pt.notified

## 2013-09-02 NOTE — Telephone Encounter (Signed)
Signs and left D ESK

## 2013-09-02 NOTE — Telephone Encounter (Signed)
LMOM to CB--Handicap placard in the drawer up front ready for p/u

## 2013-09-03 ENCOUNTER — Telehealth: Payer: Self-pay

## 2013-09-03 NOTE — Telephone Encounter (Signed)
Pt had called to report PA needed for Adderall 15 mg. Completed on the phone and advised pt that decision should be made by this afternoon and I will contact him when I hear from ins.

## 2013-09-16 ENCOUNTER — Encounter: Payer: Self-pay | Admitting: Internal Medicine

## 2013-09-16 MED ORDER — SILDENAFIL CITRATE 100 MG PO TABS: 50.0000 mg | ORAL_TABLET | Freq: Every day | ORAL | Status: DC | PRN

## 2013-09-16 NOTE — Telephone Encounter (Signed)
Meds ordered this encounter  Medications  . sildenafil (VIAGRA) 100 MG tablet    Sig: Take 0.5-1 tablets (50-100 mg total) by mouth daily as needed for erectile dysfunction.    Dispense:  5 tablet    Refill:  11   For local use instead of mail order

## 2013-09-20 ENCOUNTER — Telehealth: Payer: Self-pay

## 2013-09-20 NOTE — Telephone Encounter (Signed)
OLGA FROM [facility] WOULD LIKE TO KNOW IF ITS OKAY TO GIVE THE PATIENT GENERIC VIAGRA 20 MGS. BEST# [phone]

## 2013-09-21 NOTE — Telephone Encounter (Signed)
lmom at pharmacy ok to change to generic viagra

## 2013-09-27 NOTE — Telephone Encounter (Signed)
Never heard dec from ins. Called pharm and they reported that Rx did go through when processed.

## 2013-10-01 ENCOUNTER — Telehealth: Payer: Self-pay | Admitting: *Deleted

## 2013-10-01 ENCOUNTER — Encounter: Payer: Self-pay | Admitting: Internal Medicine

## 2013-10-01 DIAGNOSIS — M25476 Effusion, unspecified foot: Secondary | ICD-10-CM

## 2013-10-01 DIAGNOSIS — M25579 Pain in unspecified ankle and joints of unspecified foot: Secondary | ICD-10-CM

## 2013-10-01 DIAGNOSIS — N529 Male erectile dysfunction, unspecified: Secondary | ICD-10-CM

## 2013-10-01 NOTE — Telephone Encounter (Signed)
[facility] in [place] called to see if they can get an rx for Revatio which has the same ingredient as the Augustin Coupe but it only comes in 20mg  tabs and is cheaper. The drug is in epic.

## 2013-10-02 MED ORDER — SILDENAFIL CITRATE 20 MG PO TABS: ORAL_TABLET | ORAL | Status: DC

## 2013-10-02 NOTE — Telephone Encounter (Signed)
What directions you want to use since its 20mg  per tab? His original dose of viagra was 100mg .

## 2013-10-02 NOTE — Telephone Encounter (Signed)
Spoke to pharm, medication filled per dr [employer] order,  Spoke to pt, hes aware of dosage instructions.

## 2013-10-02 NOTE — Telephone Encounter (Signed)
ok 

## 2013-10-02 NOTE — Telephone Encounter (Signed)
Ok to call that in to [facility]---#8 refill x5

## 2013-10-02 NOTE — Telephone Encounter (Signed)
Called pharmacy, they stated the rx for Revatio can be filled for #50 for $80 if this is acceptable , instead of #8     rf x 5  will cost $30. The patient is requesting this rx be filled as such.  Please advise.

## 2013-10-02 NOTE — Telephone Encounter (Signed)
Try 20,then 40 then 60 etc til finds dose that works

## 2013-10-07 ENCOUNTER — Ambulatory Visit: Payer: Medicare Other

## 2013-10-07 ENCOUNTER — Ambulatory Visit (INDEPENDENT_AMBULATORY_CARE_PROVIDER_SITE_OTHER): Payer: Medicare Other | Admitting: Family Medicine

## 2013-10-07 VITALS — BP 130/90 | HR 65 | Temp 97.6°F | Resp 16 | Ht 68.5 in | Wt 227.0 lb

## 2013-10-07 DIAGNOSIS — M25511 Pain in right shoulder: Secondary | ICD-10-CM

## 2013-10-07 DIAGNOSIS — S46919A Strain of unspecified muscle, fascia and tendon at shoulder and upper arm level, unspecified arm, initial encounter: Secondary | ICD-10-CM

## 2013-10-07 DIAGNOSIS — M25519 Pain in unspecified shoulder: Secondary | ICD-10-CM | POA: Diagnosis not present

## 2013-10-07 DIAGNOSIS — W19XXXA Unspecified fall, initial encounter: Secondary | ICD-10-CM

## 2013-10-07 DIAGNOSIS — IMO0002 Reserved for concepts with insufficient information to code with codable children: Secondary | ICD-10-CM

## 2013-10-07 MED ORDER — METHYLPREDNISOLONE ACETATE 80 MG/ML IJ SUSP
80.0000 mg | Freq: Once | INTRAMUSCULAR | Status: AC
  Administered 2013-10-07: 80 mg via INTRA_ARTICULAR

## 2013-10-07 MED ORDER — HYDROCODONE-ACETAMINOPHEN 5-325 MG PO TABS: 1.0000 | ORAL_TABLET | Freq: Four times a day (QID) | ORAL | Status: DC | PRN

## 2013-10-07 NOTE — Progress Notes (Signed)
° °  Subjective:    Patient ID: Gary Parrish, male    DOB: June 23, 1941, 73 y.o.   MRN: [id number] This chart was scribed for Robyn Haber, MD by Terressa Koyanagi, ED Scribe at [facility]. This patient was seen in room Room 14 and the patient's care was started at 9:43 AM.  HPI HPI Comments: Gary Parrish is a 73 y.o. male, with a history of HLD, CA, CVA, HTN, hip pain (right), knee pain (right), fatigue, and chest discomfort, who presents to the [facility] complaining of a fall sustained yesterday while he was working on his yard. Pt reports he caught his fall with his right arm. Pt further reports that he hit his head on a branch, however, denies LOC or any current head pain. Pt also complains of associated right shoulder pain and mild hip pain. Pt reports that his right shoulder pain is aggravated with movement and it is very painful for him to reach his right arm behind his back.   Review of Systems  Skin:       Right shoulder pain and right hip pain   Neurological: Negative for syncope and headaches.  All other systems reviewed and are negative.      Objective:   Physical Exam Patient has no localized tenderness over the right shoulder but he does have extreme limitation of range of motion with inability to high per extend, abduct, or intra-rotate the shoulder on the right. There is no bony abnormality noted.  [facility] reading (PRIMARY) by  Dr. Joseph Art: negative right shoulder   After establishing bony landmarks, 1 cc of Marcaine and 1 cc of Depo-Medrol were injected into the anterior right shoulder joint line without complication.      Assessment & Plan:  9:48 AM-Discussed treatment plan which includes imaging with pt at bedside and pt agreed to plan.   10:12 AM: Pt is administered a shot of depo-medrol injection on the right shoulder. Also discussed shoulder sling and pain meds with pt, pt agreed to plan. Further, Pt is advised to rotate his right  arm while it is hanging down a few times a day.   Right shoulder pain - Plan: DG Shoulder Right, HYDROcodone-acetaminophen (NORCO) 5-325 MG per tablet, methylPREDNISolone acetate (DEPO-MEDROL) injection 80 mg  Fall - Plan: DG Shoulder Right  Signed, Robyn Haber, MD

## 2013-10-27 ENCOUNTER — Other Ambulatory Visit: Payer: Self-pay | Admitting: Internal Medicine

## 2013-10-28 ENCOUNTER — Other Ambulatory Visit: Payer: Self-pay | Admitting: Internal Medicine

## 2013-10-28 MED ORDER — AMPHETAMINE-DEXTROAMPHETAMINE 15 MG PO TABS: 15.0000 mg | ORAL_TABLET | Freq: Two times a day (BID) | ORAL | Status: DC

## 2013-10-28 NOTE — Telephone Encounter (Signed)
Notified pt Rxs Adderall ready for p/up.

## 2013-10-28 NOTE — Telephone Encounter (Signed)
Meds ordered this encounter  Medications  . amphetamine-dextroamphetamine (ADDERALL) 15 MG tablet    Sig: Take 1 tablet (15 mg total) by mouth 2 (two) times daily. For 60d after signing    Dispense:  60 tablet    Refill:  0  . amphetamine-dextroamphetamine (ADDERALL) 15 MG tablet    Sig: Take 1 tablet (15 mg total) by mouth 2 (two) times daily.    Dispense:  60 tablet    Refill:  0  . amphetamine-dextroamphetamine (ADDERALL) 15 MG tablet    Sig: Take 1 tablet (15 mg total) by mouth 2 (two) times daily. For 30 d after signing    Dispense:  60 tablet    Refill:  0

## 2013-11-20 ENCOUNTER — Ambulatory Visit (INDEPENDENT_AMBULATORY_CARE_PROVIDER_SITE_OTHER): Payer: Medicare Other | Admitting: Internal Medicine

## 2013-11-20 ENCOUNTER — Encounter: Payer: Self-pay | Admitting: Internal Medicine

## 2013-11-20 VITALS — BP 116/70 | HR 68 | Temp 98.3°F | Resp 16 | Ht 68.5 in | Wt 220.6 lb

## 2013-11-20 DIAGNOSIS — Z125 Encounter for screening for malignant neoplasm of prostate: Secondary | ICD-10-CM | POA: Diagnosis not present

## 2013-11-20 DIAGNOSIS — E785 Hyperlipidemia, unspecified: Secondary | ICD-10-CM | POA: Diagnosis not present

## 2013-11-20 DIAGNOSIS — R5383 Other fatigue: Secondary | ICD-10-CM

## 2013-11-20 DIAGNOSIS — N529 Male erectile dysfunction, unspecified: Secondary | ICD-10-CM | POA: Diagnosis not present

## 2013-11-20 DIAGNOSIS — F988 Other specified behavioral and emotional disorders with onset usually occurring in childhood and adolescence: Secondary | ICD-10-CM | POA: Diagnosis not present

## 2013-11-20 DIAGNOSIS — Z23 Encounter for immunization: Secondary | ICD-10-CM

## 2013-11-20 DIAGNOSIS — I10 Essential (primary) hypertension: Secondary | ICD-10-CM

## 2013-11-20 DIAGNOSIS — Z8546 Personal history of malignant neoplasm of prostate: Secondary | ICD-10-CM | POA: Diagnosis not present

## 2013-11-20 DIAGNOSIS — R5381 Other malaise: Secondary | ICD-10-CM

## 2013-11-20 DIAGNOSIS — Z0000 Encounter for general adult medical examination without abnormal findings: Secondary | ICD-10-CM | POA: Diagnosis not present

## 2013-11-20 DIAGNOSIS — J301 Allergic rhinitis due to pollen: Secondary | ICD-10-CM | POA: Diagnosis not present

## 2013-11-20 DIAGNOSIS — I639 Cerebral infarction, unspecified: Secondary | ICD-10-CM

## 2013-11-20 DIAGNOSIS — E782 Mixed hyperlipidemia: Secondary | ICD-10-CM

## 2013-11-20 LAB — LIPID PANEL
Cholesterol: 117 mg/dL (ref 0–200)
HDL: 45 mg/dL (ref >39)
LDL CALC: 40 mg/dL (ref 0–99)
Total CHOL/HDL Ratio: 2.6 Ratio
Triglycerides: 158 mg/dL — ABNORMAL HIGH (ref <150)
VLDL: 32 mg/dL (ref 0–40)

## 2013-11-20 LAB — COMPLETE METABOLIC PANEL WITH GFR
ALT: 28 U/L (ref 0–53)
AST: 25 U/L (ref 0–37)
Albumin: 3.8 g/dL (ref 3.5–5.2)
Alkaline Phosphatase: 74 U/L (ref 39–117)
BILIRUBIN TOTAL: 0.6 mg/dL (ref 0.2–1.2)
BUN: 18 mg/dL (ref 6–23)
CO2: 25 meq/L (ref 19–32)
CREATININE: 1.01 mg/dL (ref 0.50–1.35)
Calcium: 9.5 mg/dL (ref 8.4–10.5)
Chloride: 104 mEq/L (ref 96–112)
GFR, EST AFRICAN AMERICAN: 86 mL/min
GFR, Est Non African American: 74 mL/min
Glucose, Bld: 87 mg/dL (ref 70–99)
Potassium: 4 mEq/L (ref 3.5–5.3)
SODIUM: 138 meq/L (ref 135–145)
TOTAL PROTEIN: 6.6 g/dL (ref 6.0–8.3)

## 2013-11-20 LAB — CBC WITH DIFFERENTIAL/PLATELET
BASOS ABS: 0 10*3/uL (ref 0.0–0.1)
Basophils Relative: 0 % (ref 0–1)
EOS ABS: 0.2 10*3/uL (ref 0.0–0.7)
Eosinophils Relative: 2 % (ref 0–5)
HCT: 45.3 % (ref 39.0–52.0)
Hemoglobin: 16 g/dL (ref 13.0–17.0)
LYMPHS ABS: 2.9 10*3/uL (ref 0.7–4.0)
Lymphocytes Relative: 35 % (ref 12–46)
MCH: 31.3 pg (ref 26.0–34.0)
MCHC: 35.3 g/dL (ref 30.0–36.0)
MCV: 88.6 fL (ref 78.0–100.0)
Monocytes Absolute: 0.7 10*3/uL (ref 0.1–1.0)
Monocytes Relative: 8 % (ref 3–12)
NEUTROS PCT: 55 % (ref 43–77)
Neutro Abs: 4.5 10*3/uL (ref 1.7–7.7)
PLATELETS: 306 10*3/uL (ref 150–400)
RBC: 5.11 MIL/uL (ref 4.22–5.81)
RDW: 13.3 % (ref 11.5–15.5)
WBC: 8.2 10*3/uL (ref 4.0–10.5)

## 2013-11-20 MED ORDER — AMPHETAMINE-DEXTROAMPHETAMINE 15 MG PO TABS: 15.0000 mg | ORAL_TABLET | Freq: Two times a day (BID) | ORAL | Status: DC

## 2013-11-20 MED ORDER — TRAZODONE HCL 100 MG PO TABS: 100.0000 mg | ORAL_TABLET | Freq: Every day | ORAL | Status: DC

## 2013-11-20 MED ORDER — FLUTICASONE PROPIONATE 50 MCG/ACT NA SUSP: NASAL | Status: DC

## 2013-11-20 MED ORDER — BENAZEPRIL HCL 40 MG PO TABS: 40.0000 mg | ORAL_TABLET | Freq: Every day | ORAL | Status: DC

## 2013-11-20 MED ORDER — AMLODIPINE BESYLATE 5 MG PO TABS: 5.0000 mg | ORAL_TABLET | Freq: Every day | ORAL | Status: DC

## 2013-11-20 MED ORDER — ZOSTER VACCINE LIVE 19400 UNT/0.65ML ~~LOC~~ SOLR: 0.6500 mL | Freq: Once | SUBCUTANEOUS | Status: DC

## 2013-11-20 MED ORDER — ATORVASTATIN CALCIUM 40 MG PO TABS: ORAL_TABLET | ORAL | Status: DC

## 2013-11-20 MED ORDER — HYDROCHLOROTHIAZIDE 12.5 MG PO TABS: 12.5000 mg | ORAL_TABLET | Freq: Every day | ORAL | Status: DC

## 2013-11-20 MED ORDER — PNEUMOCOCCAL 13-VAL CONJ VACC IM SUSP: 0.5000 mL | INTRAMUSCULAR | Status: DC

## 2013-11-20 MED ORDER — OXYBUTYNIN CHLORIDE ER 5 MG PO TB24
5.0000 mg | ORAL_TABLET | Freq: Every day | ORAL | Status: DC
Start: 2013-11-20 — End: 2014-06-04

## 2013-11-20 NOTE — Progress Notes (Signed)
This chart was scribed for Leandrew Koyanagi, MD by Eston Mould, ED Scribe. This patient was seen in room Room/bed 24 and the patient's care was started at 4:01 PM.  Subjective:    Patient ID: Gary Parrish, male    DOB: 08/13/1940, 73 y.o.   MRN: [id number] Chief Complaint  Patient presents with  . Annual Exam   HPI Gary Parrish is a 73 y.o. male with hx of stroke last year, HTN, GERD, and multiple cancers who presents to the [facility] for annual exam. States everything is going well and states he feels his strength is improving. Pt states as his days progress, his fatigue increases; states he feels invisible in the morning but towards the end of the day, he feels the fatigue. Denies having trouble while driving in the afternoon. He c/o sleep disturbance lately; states he only wakes up to urinate. Pt reports having urinary urgency; reports having "occasional accidents" and is taking Oxybutynin. States the sneezing and eye itching are secondary to seasonal allergies and reports using Flonase frequently/daily. States he is still using Viagra. Denies having complications with his medications.  Denies back pain and numbness or weakness to LE.  States his Adderall is "doing well" and generally takes 2 and on occasion he may take 3; states the 3rd pill helps him with the latter part of the day.  Patient Active Problem List   Diagnosis Date Noted  . CVA (cerebral vascular accident)--much improved 12/05/2012  . ED (erectile dysfunction)--resp to silendifil 12/05/2012  . Attention deficit disorder without mention of hyperactivity--rellay does well w/ meds 03/10/2011  . HTN (hypertension)-stable 03/10/2011  . FATIGUE---afternoons without need for sleep 01/01/2010  . CHEST DISCOMFORT 01/01/2010  . HIP PAIN, RIGHT 02/11/2009  . KNEE PAIN, RIGHT 02/11/2009  . GERD 09/16/2008  . DIVERTICULOSIS, COLON 09/16/2008  . PROSTATE CANCER, HX OF---f/u [facility] 09/16/2008  . SKIN CANCER, HX OF  09/16/2008  . HYPERLIPIDEMIA 05/31/2007  . METABOLIC SYNDROME X 78/29/5621  . 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 UNS ADVRS EFF UNS RX MEDICINAL&BIOLOGICAL SBSTNC 05/31/2007  . COLONIC POLYPS, HX OF 07/21/2006   Current outpatient prescriptions:amLODipine (NORVASC) 5 MG tablet, Take 1 tablet (5 mg total) by mouth daily., Disp: 90 tablet, Rfl: 3;  amphetamine-dextroamphetamine (ADDERALL) 15 MG tablet, Take 1 tablet (15 mg total) by mouth 2 (two) times daily. For 60d after signing, Disp: 60 tablet, Rfl: 0;  amphetamine-dextroamphetamine (ADDERALL) 15 MG tablet, Take 1 tablet (15 mg total) by mouth 2 (two) times daily., Disp: 60 tablet, Rfl: 0 amphetamine-dextroamphetamine (ADDERALL) 15 MG tablet, Take 1 tablet (15 mg total) by mouth 2 (two) times daily. For 30 d after signing, Disp: 60 tablet, Rfl: 0;  aspirin 325 MG tablet, Take 325 mg by mouth daily., Disp: , Rfl: ;  atorvastatin (LIPITOR) 40 MG tablet, TAKE ONE TABLET BY MOUTH DAILY, Disp: 90 tablet, Rfl: 0;  benazepril (LOTENSIN) 40 MG tablet, Take 1 tablet (40 mg total) by mouth daily., Disp: 90 tablet, Rfl: 3 FIBER PO, Take 1 tablet by mouth daily.  , Disp: , Rfl: ;  fluticasone (FLONASE) 50 MCG/ACT nasal spray, 2 sprays each nostril at bedtime, Disp: 16 g, Rfl: 6;  hydrochlorothiazide (HYDRODIURIL) 12.5 MG tablet, Take 1 tablet (12.5 mg total) by mouth daily., Disp: 90 tablet, Rfl: 3;  Multiple Vitamins-Minerals (VISION VITAMINS PO), Take 1 tablet by mouth daily.  , Disp: , Rfl:  omeprazole (PRILOSEC) 10 MG capsule, Take 10 mg  by mouth daily., Disp: , Rfl: ;  OVER THE COUNTER MEDICATION, OTC Vitamin C 250 mg bid, Disp: , Rfl: ;  oxybutynin (DITROPAN-XL) 5 MG 24 hr tablet, Take 1 tablet (5 mg total) by mouth daily., Disp: 30 tablet, Rfl: 5;  sildenafil (REVATIO) 20 MG tablet, Take 20 mg, then 40mg , then 60, mg etc. Until desired therapeutic dosage is achieved., Disp: 50 tablet, Rfl: 5 sildenafil (VIAGRA) 100 MG tablet, Take 1  tablet (100 mg total) by mouth daily as needed., Disp: 24 tablet, Rfl: 3;  sildenafil (VIAGRA) 100 MG tablet, Take 0.5-1 tablets (50-100 mg total) by mouth daily as needed for erectile dysfunction., Disp: 5 tablet, Rfl: 11;  traZODone (DESYREL) 100 MG tablet, Take 1 tablet (100 mg total) by mouth at bedtime., Disp: 90 tablet, Rfl: 3 glucosamine-chondroitin 500-400 MG tablet, Take 1 tablet by mouth daily. Patient states he takes 1500 daily, Disp: , Rfl: ;  HYDROcodone-acetaminophen (NORCO) 5-325 MG per tablet, Take 1 tablet by mouth every 6 (six) hours as needed for moderate pain., Disp: 30 tablet, Rfl: 0  Review of Systems  Constitutional: Positive for fatigue. Negative for appetite change.  HENT: Positive for sneezing. Negative for hearing loss and mouth sores.   Eyes: Positive for photophobia and itching.  Genitourinary: Positive for urgency.  Musculoskeletal: Negative for back pain.  Allergic/Immunologic: Positive for environmental allergies.  All other systems reviewed and are negative.  Objective:   Physical Exam  Nursing note and vitals reviewed. Constitutional: He is oriented to person, place, and time. He appears well-developed and well-nourished. No distress.  HENT:  Head: Normocephalic and atraumatic.  Right Ear: External ear normal.  Left Ear: External ear normal.  Nose: Nose normal.  Mouth/Throat: Oropharynx is clear and moist.  Eyes: Conjunctivae and EOM are normal. Pupils are equal, round, and reactive to light.  Neck: Normal range of motion. Neck supple. No thyromegaly present.  Cardiovascular: Normal rate, regular rhythm, normal heart sounds and intact distal pulses.  Exam reveals no gallop and no friction rub.   No murmur heard. Pulmonary/Chest: Effort normal and breath sounds normal. No respiratory distress. He has no wheezes. He has no rales. He exhibits no tenderness.  Abdominal: Soft. Bowel sounds are normal. He exhibits no distension and no mass. There is no  tenderness. There is no rebound and no guarding.  Musculoskeletal: Normal range of motion. He exhibits no edema and no tenderness.  Lymphadenopathy:    He has no cervical adenopathy.  Neurological: He is alert and oriented to person, place, and time. He has normal reflexes. No cranial nerve deficit. He exhibits normal muscle tone. Coordination normal.  Skin: Skin is warm and dry. No rash noted. He is not diaphoretic.  Psychiatric: He has a normal mood and affect. His behavior is normal. Judgment and thought content normal.   Triage Vitals:BP 116/70  Pulse 68  Temp(Src) 98.3 F (36.8 C) (Oral)  Resp 16  Ht 5' 8.5" (1.74 m)  Wt 220 lb 9.6 oz (100.064 kg)  BMI 33.05 kg/m2  SpO2 95% Assessment & Plan:  Routine general medical examination at a health care facility - Plan: CBC with Differential, COMPLETE METABOLIC PANEL WITH GFR, Lipid panel, TSH  Attention deficit disorder without mention of hyperactivity  Allergic rhinitis due to pollen  Erectile dysfunction - Plan: CBC with Differential, TSH  Other and unspecified hyperlipidemia - Plan: CBC with Differential, Lipid panel, TSH  Unspecified essential hypertension - Plan: CBC with Differential, COMPLETE METABOLIC PANEL WITH GFR, Lipid panel,  TSH  History of prostate cancer - Plan: TSH  Need for prophylactic vaccination with combined diphtheria-tetanus-pertussis (DTP) vaccine - Plan: Tdap vaccine greater than or equal to 7yo IM  Need for prophylactic vaccination against Streptococcus pneumoniae (pneumococcus) - Plan: pneumococcal 13-valent conjugate vaccine (PREVNAR 13) injection 0.5 mL  Special screening for malignant neoplasm of prostate - Plan: TSH  Need for shingles vaccine - Plan: zoster vaccine live, PF, (ZOSTAVAX) 16073 UNT/0.65ML injection  CVA (cerebral vascular accident)  ED (erectile dysfunction)  FATIGUE  HTN (hypertension)  HYPERLIPIDEMIA  Meds ordered this encounter  Medications  .  amphetamine-dextroamphetamine (ADDERALL) 15 MG tablet    Sig: Take 1 tablet (15 mg total) by mouth 2 (two) times daily. For 30 d after signing    Dispense:  60 tablet    Refill:  0  . amphetamine-dextroamphetamine (ADDERALL) 15 MG tablet    Sig: Take 1 tablet (15 mg total) by mouth 2 (two) times daily.    Dispense:  60 tablet    Refill:  0  . amphetamine-dextroamphetamine (ADDERALL) 15 MG tablet    Sig: Take 1 tablet (15 mg total) by mouth 2 (two) times daily. For 60d after signing    Dispense:  60 tablet    Refill:  0  . pneumococcal 13-valent conjugate vaccine (PREVNAR 13) injection 0.5 mL    Sig:   . zoster vaccine live, PF, (ZOSTAVAX) 71062 UNT/0.65ML injection    Sig: Inject 19,400 Units into the skin once.    Dispense:  1 each    Refill:  0  . traZODone (DESYREL) 100 MG tablet    Sig: Take 1 tablet (100 mg total) by mouth at bedtime.    Dispense:  90 tablet    Refill:  3  . oxybutynin (DITROPAN-XL) 5 MG 24 hr tablet    Sig: Take 1 tablet (5 mg total) by mouth daily.    Dispense:  30 tablet    Refill:  5  . hydrochlorothiazide (HYDRODIURIL) 12.5 MG tablet    Sig: Take 1 tablet (12.5 mg total) by mouth daily.    Dispense:  90 tablet    Refill:  3  . fluticasone (FLONASE) 50 MCG/ACT nasal spray    Sig: 2 sprays each nostril at bedtime    Dispense:  16 g    Refill:  6  . atorvastatin (LIPITOR) 40 MG tablet    Sig: TAKE ONE TABLET BY MOUTH DAILY    Dispense:  90 tablet    Refill:  3  . benazepril (LOTENSIN) 40 MG tablet    Sig: Take 1 tablet (40 mg total) by mouth daily.    Dispense:  90 tablet    Refill:  3    Order Specific Question:  Supervising Provider    Answer:  Quintyn Dombek P [[id number]]  . amLODipine (NORVASC) 5 MG tablet    Sig: Take 1 tablet (5 mg total) by mouth daily.    Dispense:  90 tablet    Refill:  3     I have completed the patient encounter in its entirety as documented by the scribe, with editing by me where necessary. Willow Reczek P. Laney Pastor,  M.D.

## 2013-11-20 NOTE — Progress Notes (Signed)
   Subjective:    Patient ID: Gary Parrish, male    DOB: May 24, 1941, 73 y.o.   MRN: [id number]  HPI    Review of Systems  Constitutional: Positive for fatigue.  HENT: Positive for sneezing.   Eyes: Positive for photophobia and pain.  Respiratory: Negative.   Cardiovascular: Negative.   Gastrointestinal: Negative.   Endocrine: Negative.   Genitourinary: Positive for urgency.  Musculoskeletal: Negative.   Skin: Negative.   Allergic/Immunologic: Positive for environmental allergies.  Neurological: Negative.   Hematological: Negative.   Psychiatric/Behavioral: Negative.        Objective:   Physical Exam        Assessment & Plan:

## 2013-11-21 LAB — TSH: TSH: 1.468 u[IU]/mL (ref 0.350–4.500)

## 2013-11-25 ENCOUNTER — Encounter: Payer: Self-pay | Admitting: Internal Medicine

## 2013-12-06 ENCOUNTER — Encounter: Payer: Self-pay | Admitting: Internal Medicine

## 2013-12-06 DIAGNOSIS — C61 Malignant neoplasm of prostate: Secondary | ICD-10-CM | POA: Diagnosis not present

## 2013-12-12 DIAGNOSIS — T148 Other injury of unspecified body region: Secondary | ICD-10-CM | POA: Diagnosis not present

## 2013-12-12 DIAGNOSIS — Z85828 Personal history of other malignant neoplasm of skin: Secondary | ICD-10-CM | POA: Diagnosis not present

## 2013-12-12 DIAGNOSIS — L821 Other seborrheic keratosis: Secondary | ICD-10-CM | POA: Diagnosis not present

## 2013-12-13 DIAGNOSIS — C61 Malignant neoplasm of prostate: Secondary | ICD-10-CM | POA: Diagnosis not present

## 2013-12-13 DIAGNOSIS — N3941 Urge incontinence: Secondary | ICD-10-CM | POA: Diagnosis not present

## 2014-02-07 ENCOUNTER — Telehealth: Payer: Self-pay | Admitting: Internal Medicine

## 2014-02-07 MED ORDER — AMPHETAMINE-DEXTROAMPHETAMINE 15 MG PO TABS: 15.0000 mg | ORAL_TABLET | Freq: Two times a day (BID) | ORAL | Status: DC

## 2014-02-07 NOTE — Telephone Encounter (Signed)
Meds ordered this encounter  Medications  . amphetamine-dextroamphetamine (ADDERALL) 15 MG tablet    Sig: Take 1 tablet (15 mg total) by mouth 2 (two) times daily. For 30 d after signing    Dispense:  60 tablet    Refill:  0  . amphetamine-dextroamphetamine (ADDERALL) 15 MG tablet    Sig: Take 1 tablet (15 mg total) by mouth 2 (two) times daily.    Dispense:  60 tablet    Refill:  0  . amphetamine-dextroamphetamine (ADDERALL) 15 MG tablet    Sig: Take 1 tablet (15 mg total) by mouth 2 (two) times daily. For 60d after signing    Dispense:  60 tablet    Refill:  0

## 2014-02-18 ENCOUNTER — Other Ambulatory Visit: Payer: Self-pay | Admitting: *Deleted

## 2014-02-18 ENCOUNTER — Ambulatory Visit (INDEPENDENT_AMBULATORY_CARE_PROVIDER_SITE_OTHER): Payer: Medicare Other | Admitting: Podiatry

## 2014-02-18 ENCOUNTER — Ambulatory Visit (INDEPENDENT_AMBULATORY_CARE_PROVIDER_SITE_OTHER): Payer: Medicare Other

## 2014-02-18 ENCOUNTER — Encounter: Payer: Self-pay | Admitting: Podiatry

## 2014-02-18 VITALS — BP 122/72 | HR 72 | Resp 16 | Ht 69.5 in | Wt 220.0 lb

## 2014-02-18 DIAGNOSIS — N529 Male erectile dysfunction, unspecified: Secondary | ICD-10-CM

## 2014-02-18 DIAGNOSIS — M67479 Ganglion, unspecified ankle and foot: Secondary | ICD-10-CM

## 2014-02-18 DIAGNOSIS — I6350 Cerebral infarction due to unspecified occlusion or stenosis of unspecified cerebral artery: Secondary | ICD-10-CM | POA: Diagnosis not present

## 2014-02-18 DIAGNOSIS — M6740 Ganglion, unspecified site: Secondary | ICD-10-CM

## 2014-02-18 DIAGNOSIS — B351 Tinea unguium: Secondary | ICD-10-CM

## 2014-02-18 DIAGNOSIS — M2040 Other hammer toe(s) (acquired), unspecified foot: Secondary | ICD-10-CM

## 2014-02-18 DIAGNOSIS — L608 Other nail disorders: Secondary | ICD-10-CM | POA: Diagnosis not present

## 2014-02-18 DIAGNOSIS — H35379 Puckering of macula, unspecified eye: Secondary | ICD-10-CM | POA: Diagnosis not present

## 2014-02-18 NOTE — Progress Notes (Signed)
   Subjective:    Patient ID: Gary Parrish, male    DOB: 08/09/1940, 73 y.o.   MRN: [id number]  HPI Comments: Left foot #3 toe , cyst on the toe, very painful, been dealing with this for several years. It gets big and than try to drain the fluid out of it, its ok for a few weeks and than it gets big again. Also would like to have my toenails looked at , they are discolored .   Toe Pain       Review of Systems  Eyes: Positive for itching.  Genitourinary: Positive for urgency.  Musculoskeletal:       Joint pain   All other systems reviewed and are negative.      Objective:   Physical Exam: I have reviewed his past medical history medications allergies surgeries social history and review of systems. Pulses are strongly palpable bilateral. Neurologic sensorium is intact per Semmes-Weinstein monofilament. Deep tendon reflexes are intact bilateral and muscle strength is 5 over 5 dorsiflexors plantar flexors inverters everters all of his musculature is intact. Orthopedic evaluation Mr. is all joints distal to the ankle a full range of motion without crepitus all hammertoe deformities noted bilateral flexible in nature. 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 Cutaneous evaluation demonstrates a mucoid cyst overlying the DIPJ of the third digit left foot. Nails also appear to be thick yellow dystrophic possibly mycotic.       Assessment & Plan:  Assessment: Nail dystrophy bilateral foot. 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  Plan: Samples of the nails were taken today do sent for pathologic evaluation. We consented him for a DIPJ arthroplasty with excision of ganglion third toe left foot. I answered all the questions regarding his procedures to the best of my ability in layman's terms. He understood it was amenable to it and signed Dr. pages of the consent form. I will followup with him  in the near future for surgery.

## 2014-02-18 NOTE — Patient Instructions (Signed)
Pre-Operative Instructions  Congratulations, you have decided to take an important step to improving your quality of life.  You can be assured that the doctors of [facility] will be with you every step of the way.  1. Plan to be at the surgery center/hospital at least 1 (one) hour prior to your scheduled time unless otherwise directed by the surgical center/hospital staff.  You must have a responsible adult accompany you, remain during the surgery and drive you home.  Make sure you have directions to the surgical center/hospital and know how to get there on time. 2. For hospital based surgery you will need to obtain a history and physical form from your family physician within 1 month prior to the date of surgery- we will give you a form for you primary physician.  3. We make every effort to accommodate the date you request for surgery.  There are however, times where surgery dates or times have to be moved.  We will contact you as soon as possible if a change in schedule is required.   4. No Aspirin/Ibuprofen for one week before surgery.  If you are on aspirin, any non-steroidal anti-inflammatory medications (Mobic, Aleve, Ibuprofen) you should stop taking it 7 days prior to your surgery.  You make take Tylenol  For pain prior to surgery.  5. Medications- If you are taking daily heart and blood pressure medications, seizure, reflux, allergy, asthma, anxiety, pain or diabetes medications, make sure the surgery center/hospital is aware before the day of surgery so they may notify you which medications to take or avoid the day of surgery. 6. No food or drink after midnight the night before surgery unless directed otherwise by surgical center/hospital staff. 7. No alcoholic beverages 24 hours prior to surgery.  No smoking 24 hours prior to or 24 hours after surgery. 8. Wear loose pants or shorts- loose enough to fit over bandages, boots, and casts. 9. No slip on shoes, sneakers are best. 10. Bring  your boot with you to the surgery center/hospital.  Also bring crutches or a walker if your physician has prescribed it for you.  If you do not have this equipment, it will be provided for you after surgery. 11. If you have not been contracted by the surgery center/hospital by the day before your surgery, call to confirm the date and time of your surgery. 12. Leave-time from work may vary depending on the type of surgery you have.  Appropriate arrangements should be made prior to surgery with your employer. 13. Prescriptions will be provided immediately following surgery by your doctor.  Have these filled as soon as possible after surgery and take the medication as directed. 14. Remove nail polish on the operative foot. 15. Wash the night before surgery.  The night before surgery wash the foot and leg well with the antibacterial soap provided and water paying special attention to beneath the toenails and in between the toes.  Rinse thoroughly with water and dry well with a towel.  Perform this wash unless told not to do so by your physician.  Enclosed: 1 Ice pack (please put in freezer the night before surgery)   1 Hibiclens skin cleaner   Pre-op Instructions  If you have any questions regarding the instructions, do not hesitate to call our office.  [place]: [address] , [place] [address] [phone]  [place]: [address] [place], [place] [address] [phone]  [place]: [address]  [place], [place] [address] [phone]  Dr. Richard   Tuchman DPM, Dr. Norman Regal DPM Dr. Richard Sikora DPM, Dr. M. Todd Hyatt DPM, Dr. Kathryn Egerton DPM 

## 2014-02-27 ENCOUNTER — Encounter (INDEPENDENT_AMBULATORY_CARE_PROVIDER_SITE_OTHER): Payer: Medicare Other | Admitting: Ophthalmology

## 2014-02-27 DIAGNOSIS — H35379 Puckering of macula, unspecified eye: Secondary | ICD-10-CM

## 2014-02-27 DIAGNOSIS — H3530 Unspecified macular degeneration: Secondary | ICD-10-CM | POA: Diagnosis not present

## 2014-02-27 DIAGNOSIS — H43819 Vitreous degeneration, unspecified eye: Secondary | ICD-10-CM

## 2014-02-27 DIAGNOSIS — I10 Essential (primary) hypertension: Secondary | ICD-10-CM | POA: Diagnosis not present

## 2014-02-27 DIAGNOSIS — H35039 Hypertensive retinopathy, unspecified eye: Secondary | ICD-10-CM | POA: Diagnosis not present

## 2014-03-01 ENCOUNTER — Other Ambulatory Visit: Payer: Self-pay | Admitting: Internal Medicine

## 2014-03-01 ENCOUNTER — Telehealth: Payer: Self-pay | Admitting: Internal Medicine

## 2014-03-04 ENCOUNTER — Other Ambulatory Visit: Payer: Self-pay | Admitting: Internal Medicine

## 2014-03-04 MED ORDER — AMPHETAMINE-DEXTROAMPHETAMINE 15 MG PO TABS: 15.0000 mg | ORAL_TABLET | Freq: Two times a day (BID) | ORAL | Status: DC

## 2014-03-04 MED ORDER — SILDENAFIL CITRATE 20 MG PO TABS: ORAL_TABLET | ORAL | Status: DC

## 2014-03-10 NOTE — Telephone Encounter (Signed)
Patient is requesting a medication refills for sedentfil sent to [facility] drug. Patient states that he requested a refill last week and it hasn't been received by the pharmacy. Please send refill and call patient Pharmacy [phone] Patient [phone]

## 2014-03-11 NOTE — Telephone Encounter (Signed)
Ok to refill 

## 2014-03-11 NOTE — Telephone Encounter (Signed)
Acc to chart i did this on 9/8

## 2014-03-13 ENCOUNTER — Telehealth: Payer: Self-pay | Admitting: *Deleted

## 2014-03-13 NOTE — Telephone Encounter (Signed)
Per Dr. Milinda Pointer, I called and informed the patient that he will need to be in town 2 consecutive weeks after the surgery for follow-up.  You will not have a pin in your toe.  He stated, "Okay, 04/11/2014 will be good, schedule it then."  I told him I would take care of it.

## 2014-03-13 NOTE — Telephone Encounter (Signed)
I'm scheduled for surgery tomorrow.  I want to know if he's going to put a pin in my toe.  Somebody told me it's hard to recover from that.  I'm scheduled to go out of town on Sunday.  I told him I don't think he's going to put a pin in it.  However, I told him he is going to need to elevate as much as possible, will also be prescribed a narcotic.  You will not be able to out and about for a few days.  He asked how long he would be not able to do things.  I told him I can't give a definitive time.  He stated, " I think I better reschedule.  What is the next date you have available?"  I told him 03/21/2014.  He stated, "I can't do that date, I will still be out of town."  I told him if he had surgery tomorrow he would have not been able to come to the post operative appointment scheduled for 03/20/2014.  He stated, Yeah I was going to have to change that date.  I offered him 04/04/2014.  He stated, "I will be out of town that day."  I offered him 04/11/2014.  He stated, "That will work."  He asked me to have Dr. Milinda Pointer call him he wants more detailed information about what to expect.  I asked if he wanted to schedule another consultation.  He stated, "No, just have him call me."

## 2014-03-20 ENCOUNTER — Encounter: Payer: Medicare Other | Admitting: Podiatry

## 2014-04-03 ENCOUNTER — Telehealth: Payer: Self-pay | Admitting: *Deleted

## 2014-04-03 NOTE — Telephone Encounter (Signed)
I left him a message to call me back on his home phone.  I reached him on his mobile.  I informed him that his culture came back positive for fungus per Dr. Milinda Pointer.  Dr. Milinda Pointer stated you can do a topical or an oral medication.  You will need to schedule an appointment to see him.  He asked, "What's the name of the oral medication?  How much does it cost?  Which is more effective, the oral or the topical or both?"  I told him the oral is the best, it can be used with the topical.  I cannot tell you if your insurance will cover it or not.  Your pharmacy can help you with that information.  The name of the oral is Lamisil and the topical is [place].  He stated, "I'm having surgery on the 16th.  I'll just wait until then to see Dr. Milinda Pointer.  I told him that is fine.

## 2014-04-04 ENCOUNTER — Encounter: Payer: Self-pay | Admitting: Podiatry

## 2014-04-09 ENCOUNTER — Telehealth: Payer: Self-pay | Admitting: *Deleted

## 2014-04-09 NOTE — Telephone Encounter (Signed)
I want to reschedule my surgery from 04/11/2014 to 04/25/2014.  I left him a message that I will reschedule the surgery to 04/25/2014.  I informed Dr. Milinda Pointer as well as the surgical center.

## 2014-04-11 ENCOUNTER — Other Ambulatory Visit: Payer: Self-pay | Admitting: Internal Medicine

## 2014-04-13 MED ORDER — AMPHETAMINE-DEXTROAMPHETAMINE 15 MG PO TABS: 15.0000 mg | ORAL_TABLET | Freq: Two times a day (BID) | ORAL | Status: DC

## 2014-04-17 ENCOUNTER — Encounter: Payer: Medicare Other | Admitting: Podiatry

## 2014-04-24 ENCOUNTER — Other Ambulatory Visit: Payer: Self-pay | Admitting: Podiatry

## 2014-04-24 MED ORDER — CEPHALEXIN 500 MG PO CAPS: 500.0000 mg | ORAL_CAPSULE | Freq: Two times a day (BID) | ORAL | Status: DC

## 2014-04-24 MED ORDER — PROMETHAZINE HCL 25 MG PO TABS: 25.0000 mg | ORAL_TABLET | Freq: Three times a day (TID) | ORAL | Status: DC | PRN

## 2014-04-24 MED ORDER — OXYCODONE-ACETAMINOPHEN 10-325 MG PO TABS: 1.0000 | ORAL_TABLET | Freq: Four times a day (QID) | ORAL | Status: DC | PRN

## 2014-04-25 DIAGNOSIS — M2042 Other hammer toe(s) (acquired), left foot: Secondary | ICD-10-CM | POA: Diagnosis not present

## 2014-04-25 DIAGNOSIS — L985 Mucinosis of the skin: Secondary | ICD-10-CM | POA: Diagnosis not present

## 2014-04-25 DIAGNOSIS — I10 Essential (primary) hypertension: Secondary | ICD-10-CM | POA: Diagnosis not present

## 2014-04-25 DIAGNOSIS — M67472 Ganglion, left ankle and foot: Secondary | ICD-10-CM | POA: Diagnosis not present

## 2014-04-25 DIAGNOSIS — M2040 Other hammer toe(s) (acquired), unspecified foot: Secondary | ICD-10-CM | POA: Diagnosis not present

## 2014-04-25 DIAGNOSIS — M6740 Ganglion, unspecified site: Secondary | ICD-10-CM

## 2014-04-25 DIAGNOSIS — M2022 Hallux rigidus, left foot: Secondary | ICD-10-CM | POA: Diagnosis not present

## 2014-04-26 ENCOUNTER — Telehealth: Payer: Self-pay

## 2014-04-26 NOTE — Telephone Encounter (Signed)
Left a message for patient to call with questions or concerns

## 2014-05-01 ENCOUNTER — Ambulatory Visit (INDEPENDENT_AMBULATORY_CARE_PROVIDER_SITE_OTHER): Payer: Medicare Other | Admitting: Podiatry

## 2014-05-01 ENCOUNTER — Encounter: Payer: Self-pay | Admitting: Podiatry

## 2014-05-01 ENCOUNTER — Ambulatory Visit (INDEPENDENT_AMBULATORY_CARE_PROVIDER_SITE_OTHER): Payer: Medicare Other

## 2014-05-01 VITALS — BP 119/82 | HR 88 | Temp 97.3°F | Resp 16

## 2014-05-01 DIAGNOSIS — Z9889 Other specified postprocedural states: Secondary | ICD-10-CM

## 2014-05-01 DIAGNOSIS — M2042 Other hammer toe(s) (acquired), left foot: Secondary | ICD-10-CM | POA: Diagnosis not present

## 2014-05-02 NOTE — Progress Notes (Signed)
He presents today 1 week status post DIPJ arthroplasty with excision of ganglion cyst/mucoid cyst third toe left foot. He denies fever chills nausea vomiting muscle aches and pains dates that he's had little pain.  Objective: Pulses are strongly palpable left foot. Dry sterile dressing was intact. Once removed demonstrates DIPJ arthroplasty with excision of ganglion cyst/mucoid cyst third left. No erythema mild edema no cellulitis drainage or odor. Sutures are intact margins are well coapted. Radiographic evaluation confirms arthroplasty DIPJ third left.  Assessment: Well-healing surgical toe.  Plan: Discussed etiology pathology conservative versus surgical therapies.redressed toe today with a dry sterile compressive dressing encouraged him to continue to stay off of this and keep the toe and foot elevated for another week he is not get this wet nor is he to go back to work as of yet.

## 2014-05-04 ENCOUNTER — Encounter: Payer: Self-pay | Admitting: Podiatry

## 2014-05-04 NOTE — Progress Notes (Signed)
Dr Milinda Pointer performed an excision of ganglion tumor 3rd left met and 3rd left met hammertoe repair on 04/25/14

## 2014-05-08 ENCOUNTER — Ambulatory Visit (INDEPENDENT_AMBULATORY_CARE_PROVIDER_SITE_OTHER): Payer: Medicare Other

## 2014-05-08 ENCOUNTER — Telehealth: Payer: Self-pay | Admitting: Radiology

## 2014-05-08 ENCOUNTER — Ambulatory Visit (INDEPENDENT_AMBULATORY_CARE_PROVIDER_SITE_OTHER): Payer: Medicare Other | Admitting: Podiatry

## 2014-05-08 VITALS — BP 120/82 | HR 71 | Temp 99.1°F | Resp 16

## 2014-05-08 DIAGNOSIS — Z9889 Other specified postprocedural states: Secondary | ICD-10-CM

## 2014-05-08 DIAGNOSIS — Z23 Encounter for immunization: Secondary | ICD-10-CM

## 2014-05-08 NOTE — Telephone Encounter (Signed)
Patient came in today for his flu shot, indicates he also wants pneumonia vaccine, but no order for this in system. Patient indicated after he checked in he will return in one hour for both, can you advise if he can also get his pneumonia shot?

## 2014-05-08 NOTE — Telephone Encounter (Signed)
This would be okay. 

## 2014-05-08 NOTE — Telephone Encounter (Signed)
Thanks order placed for Prevnar

## 2014-05-08 NOTE — Progress Notes (Signed)
   Subjective:    Patient ID: Gary Parrish, male    DOB: 1940/09/04, 73 y.o.   MRN: [id number]  HPI  -patient came in for flu shot, also requested pneumonia shot, no order for this. Patient indicated he has to leave and will come back in one hour. I have sent message to Dr Laney Pastor to see if we can get an order for the pneumonia vaccine as well.   Review of Systems     Objective:   Physical Exam        Assessment & Plan:

## 2014-05-08 NOTE — Progress Notes (Signed)
   Subjective:    Patient ID: Gary Parrish, male    DOB: 16-Apr-1941, 73 y.o.   MRN: [id number]  HPI Patient here for prevnar and flu injections per Dr. Laney Pastor.   Review of Systems     Objective:   Physical Exam        Assessment & Plan:

## 2014-05-09 NOTE — Progress Notes (Signed)
He presents today for follow-up of his mallet toe repair with excision mucoid cyst third digit left foot. He denies fever chills nausea vomiting muscle aches and pains and states that is coming along.  Objective: Vital signs are stable he is alert and oriented 3 sutures are intact her margins are well coapted we removed the sutures today there's no signs of infection.  Assessment: Well-healing surgical toe third left.  Plan: Demonstrated to he and his wife how to wrap the toe daily with Coban and and I dispensed 2 rolls to them. I will follow-up with him in 2 weeks he is to continue use of his Darco shoe.

## 2014-05-12 DIAGNOSIS — H01003 Unspecified blepharitis right eye, unspecified eyelid: Secondary | ICD-10-CM | POA: Diagnosis not present

## 2014-05-13 ENCOUNTER — Encounter: Payer: Self-pay | Admitting: Internal Medicine

## 2014-05-27 ENCOUNTER — Ambulatory Visit (INDEPENDENT_AMBULATORY_CARE_PROVIDER_SITE_OTHER): Payer: Medicare Other

## 2014-05-27 ENCOUNTER — Ambulatory Visit (INDEPENDENT_AMBULATORY_CARE_PROVIDER_SITE_OTHER): Payer: Medicare Other | Admitting: Podiatry

## 2014-05-27 VITALS — BP 119/78 | HR 80 | Resp 16

## 2014-05-27 DIAGNOSIS — Z9889 Other specified postprocedural states: Secondary | ICD-10-CM

## 2014-05-27 DIAGNOSIS — M205X2 Other deformities of toe(s) (acquired), left foot: Secondary | ICD-10-CM

## 2014-05-27 DIAGNOSIS — M2042 Other hammer toe(s) (acquired), left foot: Secondary | ICD-10-CM

## 2014-05-27 NOTE — Progress Notes (Signed)
He presents today for follow-up of his DIPJ arthroplasty third digit left foot. He states that he has not been wrapping it or soaking it lately.  Objective: Vital signs are stable he's alert and oriented 3. Third digit left foot appears to be bolused at the DIPJ secondary to noncompliance. Radiographic evaluation confirms a complete arthroplasty DIPJ.  Assessment: Well-healed surgical toe moderate edema.  Plan: Encouraged him to get back to his regular shoe gear but to wrap the toe for the next weeks.

## 2014-06-04 ENCOUNTER — Encounter: Payer: Self-pay | Admitting: Internal Medicine

## 2014-06-04 ENCOUNTER — Ambulatory Visit (INDEPENDENT_AMBULATORY_CARE_PROVIDER_SITE_OTHER): Payer: Medicare Other | Admitting: Internal Medicine

## 2014-06-04 VITALS — BP 145/73 | HR 80 | Temp 98.3°F | Resp 16 | Ht 68.5 in | Wt 231.0 lb

## 2014-06-04 DIAGNOSIS — F988 Other specified behavioral and emotional disorders with onset usually occurring in childhood and adolescence: Secondary | ICD-10-CM

## 2014-06-04 DIAGNOSIS — I639 Cerebral infarction, unspecified: Secondary | ICD-10-CM

## 2014-06-04 DIAGNOSIS — F909 Attention-deficit hyperactivity disorder, unspecified type: Secondary | ICD-10-CM

## 2014-06-04 DIAGNOSIS — N529 Male erectile dysfunction, unspecified: Secondary | ICD-10-CM | POA: Diagnosis not present

## 2014-06-04 DIAGNOSIS — I10 Essential (primary) hypertension: Secondary | ICD-10-CM

## 2014-06-04 MED ORDER — FLUTICASONE PROPIONATE 50 MCG/ACT NA SUSP: NASAL | Status: DC

## 2014-06-04 MED ORDER — AMPHETAMINE-DEXTROAMPHETAMINE 15 MG PO TABS: 15.0000 mg | ORAL_TABLET | Freq: Two times a day (BID) | ORAL | Status: DC

## 2014-06-04 MED ORDER — SILDENAFIL CITRATE 20 MG PO TABS: ORAL_TABLET | ORAL | Status: DC

## 2014-06-04 NOTE — Patient Instructions (Signed)
[place] website Look for [employer] extract Also chondroitin

## 2014-06-04 NOTE — Progress Notes (Signed)
   Subjective:    Patient ID: Gary Parrish, male    DOB: [birth year], 73 y.o.   MRN: [id number]  HPI  Chief Complaint  Patient presents with  . Labs Only    chicken pox titer per pt  . Allergic Rhinitis     Medication refills  . 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 CVA (cerebral vascular accident) 12/05/2012  . ED (erectile dysfunction) 12/05/2012  . Attention deficit disorder 03/10/2011  . HTN (hypertension) 03/10/2011  . FATIGUE 01/01/2010  . CHEST DISCOMFORT 01/01/2010  . HIP PAIN, RIGHT 02/11/2009  . KNEE PAIN, RIGHT 02/11/2009  . GERD 09/16/2008  . DIVERTICULOSIS, COLON 09/16/2008  . PROSTATE CANCER, HX OF 09/16/2008  . SKIN CANCER, HX OF 09/16/2008  . HYPERLIPIDEMIA 05/31/2007  . METABOLIC SYNDROME X 50/56/9794  . 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 UNS ADVRS EFF UNS RX MEDICINAL&BIOLOGICAL SBSTNC 05/31/2007  . COLONIC POLYPS, HX OF 07/21/2006      Review of Systems No headaches or vision changes No further symptoms related to his stroke No chest pain or palpitations He is actually improved his activity level    Objective:   Physical Exam BP 145/73 mmHg  Pulse 80  Temp(Src) 98.3 F (36.8 C)  Resp 16  Ht 5' 8.5" (1.74 m)  Wt 231 lb (104.781 kg)  BMI 34.61 kg/m2  SpO2 93% HEENT clear Heart regular Alert and oriented Cranial nerves II through XII intact Mood good affect appropriate       Assessment & Plan:  Problem #1 screening for past chickenpox Problem #2  ADD Problem #3 allergic rhinitis Problem #4 ED  Meds ordered this encounter  Medications  . sildenafil (REVATIO) 20 MG tablet    Sig: Take 20 mg, then 40mg , then 60, mg etc. Until desired therapeutic dosage is achieved.    Dispense:  50 tablet    Refill:  5  . amphetamine-dextroamphetamine (ADDERALL) 15 MG tablet    Sig: Take 1 tablet by mouth 2 (two) times daily. For 60d after signing    Dispense:  60 tablet    Refill:  0  . amphetamine-dextroamphetamine (ADDERALL) 15 MG tablet    Sig: Take 1 tablet by mouth 2 (two) times daily. For 30 d after signing    Dispense:  60 tablet    Refill:  0  . amphetamine-dextroamphetamine (ADDERALL) 15 MG tablet    Sig: Take 1 tablet by mouth 2 (two) times daily.    Dispense:  60 tablet    Refill:  0  . fluticasone (FLONASE) 50 MCG/ACT nasal spray    Sig: 2 sprays each nostril at bedtime    Dispense:  16 g    Refill:  6   F/u 3-76mo

## 2014-06-05 LAB — VARICELLA ZOSTER ANTIBODY, IGG: Varicella IgG: 1796 Index — ABNORMAL HIGH (ref <135.00)

## 2014-06-09 ENCOUNTER — Encounter: Payer: Self-pay | Admitting: Podiatry

## 2014-06-11 MED ORDER — ZOSTER VACCINE LIVE 19400 UNT/0.65ML ~~LOC~~ SOLR: 0.6500 mL | Freq: Once | SUBCUTANEOUS | Status: DC

## 2014-06-11 NOTE — Addendum Note (Signed)
Addended by: Constance Goltz on: 06/11/2014 04:41 PM   Modules accepted: Orders

## 2014-06-23 ENCOUNTER — Encounter: Payer: Self-pay | Admitting: Podiatry

## 2014-07-08 ENCOUNTER — Other Ambulatory Visit: Payer: Self-pay | Admitting: Internal Medicine

## 2014-07-30 ENCOUNTER — Encounter: Payer: Medicare Other | Admitting: Internal Medicine

## 2014-07-30 ENCOUNTER — Encounter: Payer: Self-pay | Admitting: Internal Medicine

## 2014-07-30 ENCOUNTER — Other Ambulatory Visit: Payer: Self-pay | Admitting: Physician Assistant

## 2014-07-31 NOTE — Telephone Encounter (Signed)
Dr Laney Pastor had sent in Rx to a different pharm on 06/04/14 $50 w/5 RFs. I am resending remaining RFs to current pharm.

## 2014-08-01 NOTE — Progress Notes (Signed)
This encounter was created in error - please disregard.

## 2014-08-02 ENCOUNTER — Other Ambulatory Visit: Payer: Self-pay | Admitting: Internal Medicine

## 2014-08-02 MED ORDER — AMPHETAMINE-DEXTROAMPHETAMINE 15 MG PO TABS: 15.0000 mg | ORAL_TABLET | Freq: Two times a day (BID) | ORAL | Status: DC

## 2014-09-08 ENCOUNTER — Encounter: Payer: Self-pay | Admitting: Internal Medicine

## 2014-09-10 ENCOUNTER — Ambulatory Visit (INDEPENDENT_AMBULATORY_CARE_PROVIDER_SITE_OTHER): Payer: Medicare Other | Admitting: Family Medicine

## 2014-09-10 ENCOUNTER — Ambulatory Visit (INDEPENDENT_AMBULATORY_CARE_PROVIDER_SITE_OTHER): Payer: Medicare Other

## 2014-09-10 ENCOUNTER — Other Ambulatory Visit: Payer: Self-pay | Admitting: Internal Medicine

## 2014-09-10 VITALS — BP 148/90 | HR 79 | Temp 98.1°F | Resp 18 | Wt 232.0 lb

## 2014-09-10 DIAGNOSIS — M25461 Effusion, right knee: Secondary | ICD-10-CM | POA: Diagnosis not present

## 2014-09-10 DIAGNOSIS — M1711 Unilateral primary osteoarthritis, right knee: Secondary | ICD-10-CM | POA: Diagnosis not present

## 2014-09-10 DIAGNOSIS — M25561 Pain in right knee: Secondary | ICD-10-CM

## 2014-09-10 DIAGNOSIS — M25569 Pain in unspecified knee: Secondary | ICD-10-CM | POA: Diagnosis not present

## 2014-09-10 LAB — URIC ACID: URIC ACID, SERUM: 6.9 mg/dL (ref 4.0–7.8)

## 2014-09-10 MED ORDER — AMPHETAMINE-DEXTROAMPHETAMINE 15 MG PO TABS: 15.0000 mg | ORAL_TABLET | Freq: Two times a day (BID) | ORAL | Status: DC

## 2014-09-10 MED ORDER — DICLOFENAC SODIUM 75 MG PO TBEC
75.0000 mg | DELAYED_RELEASE_TABLET | Freq: Two times a day (BID) | ORAL | Status: DC
Start: 2014-09-10 — End: 2014-12-31

## 2014-09-10 MED ORDER — TRAMADOL HCL 50 MG PO TABS: 50.0000 mg | ORAL_TABLET | Freq: Three times a day (TID) | ORAL | Status: DC | PRN

## 2014-09-10 NOTE — Progress Notes (Signed)
Subjective: 74 year old man who is here with a couple of week history of pain in his right knee. It hurts to flex the knee. He can walk with his knee stiff, but if his ending the knee it hurts him. It is been feeling warm. Knows of no trauma. He doesn't recall prior problems with it, though there is an old x-ray in the chart from about 6 years ago. He does not have any history of gout. There have been no recent changes in his medications. He still works, shortened hours, but goes in several hours every day to the job place. He is not doing physical labor.  Objective: Right knee is visibly swollen. Mildly warm to touch. There is a palpable effusion to a moderate degree. Pain on flexion and extension. No weakness of the joint itself.  Assessment: Right knee pain and effusion  Plan: X-ray Uric acid level Probably will need to do an aspiration of the knee.  [facility] reading (PRIMARY) by  Dr. Linna Darner Medial joint space narrowing consistent with early osteoarthritic changes  Procedure note: Using sterile technique the right knee was aspirated with a 22-gauge needle and ethyl chloride spray. Medial aspiration was attempted. The needle was inserted with a dry tap. Going just below the first stick the attempt was made again. Although a effusion could be palpated, no fluid was obtained. No further attempts were made.  If he gets worse from his arthritic flare further attempts to aspirate will be necessary.Marland Kitchen

## 2014-09-10 NOTE — Patient Instructions (Signed)
Take the diclofenac one twice daily with food. This is an anti-inflammatory medicine for pain and inflammation in the knee.  Take the tramadol pain pills every 8 hours only if needed for severe pain not relieved by the diclofenac  If knee is not improving please return  We will let you know the results of your blood test which is checking for the possibility of gout in your system.

## 2014-09-10 NOTE — Telephone Encounter (Signed)
Refilled #60 which is a 30 day supply for the pt since Dr Laney Pastor is out of town. He will need to come in to see Dr Laney Pastor for any further refills of this medication. The script is at the clinic waiting for him.

## 2014-09-10 NOTE — Telephone Encounter (Signed)
adderall refill request. Please advise

## 2014-09-18 ENCOUNTER — Ambulatory Visit (INDEPENDENT_AMBULATORY_CARE_PROVIDER_SITE_OTHER): Payer: Medicare Other | Admitting: Family Medicine

## 2014-09-18 VITALS — BP 120/82 | HR 68 | Temp 97.7°F | Resp 16 | Ht 68.0 in | Wt 230.0 lb

## 2014-09-18 DIAGNOSIS — M1711 Unilateral primary osteoarthritis, right knee: Secondary | ICD-10-CM

## 2014-09-18 DIAGNOSIS — M25561 Pain in right knee: Secondary | ICD-10-CM

## 2014-09-18 MED ORDER — METHYLPREDNISOLONE ACETATE 80 MG/ML IJ SUSP
80.0000 mg | Freq: Once | INTRAMUSCULAR | Status: AC
  Administered 2014-09-18: 80 mg via INTRA_ARTICULAR

## 2014-09-18 MED ORDER — TRAMADOL HCL 50 MG PO TABS
50.0000 mg | ORAL_TABLET | Freq: Three times a day (TID) | ORAL | Status: DC | PRN
Start: 2014-09-18 — End: 2014-09-29

## 2014-09-18 NOTE — Patient Instructions (Signed)
Apply some ice to the knee this several times this evening  Use the tramadol pain pills every 8 hours when needed  Finish taking the diclofenac, but I will not be able to keep you on that long-term.  If not improved by next week we will be happy to make a referral to you to an orthopedic doctor.  Return to see Dr. Laney Pastor for your routine appointment in the near future

## 2014-09-18 NOTE — Progress Notes (Signed)
Subjective: Patient's knee continues to hurt despite medication. He is a little bit, but not a lot. He is still limping a lot. He would like checked. We discussed referral versus injection abnormal for me to go ahead and put a shot of cortisone and see how it does.  Objective: I can still palpable effusion in the right knee joint. X-ray reviewed. Labs reviewed.  Assessment: DJD right knee with effusion Knee pain  Plan: We'll inject with cortisone. It is not doing better by next, or not improving anyhow, I will refer him onto an orthopedist requested.  Procedure note: Using a lateral approach and sterile technique the area was numbed with ethyl chloride and with 1% lidocaine. Lateral aspiration was attempted. No fluid was obtained. I am not certain why the difficulty as I felt like hours in the space nicely. When ahead and injected 40 mg of Depo-Medrol and 1 mL of 2% lidocaine. It. Hanley Seamen some immediate improvement. Patient was instructed on its care.

## 2014-09-29 ENCOUNTER — Ambulatory Visit (INDEPENDENT_AMBULATORY_CARE_PROVIDER_SITE_OTHER): Payer: Medicare Other | Admitting: Internal Medicine

## 2014-09-29 VITALS — BP 130/90 | HR 95 | Temp 97.4°F | Resp 16 | Ht 68.0 in | Wt 231.0 lb

## 2014-09-29 DIAGNOSIS — M1711 Unilateral primary osteoarthritis, right knee: Secondary | ICD-10-CM

## 2014-09-29 DIAGNOSIS — M25561 Pain in right knee: Secondary | ICD-10-CM

## 2014-09-29 DIAGNOSIS — M2391 Unspecified internal derangement of right knee: Secondary | ICD-10-CM

## 2014-09-29 MED ORDER — TRAMADOL HCL 50 MG PO TABS: 50.0000 mg | ORAL_TABLET | Freq: Four times a day (QID) | ORAL | Status: DC | PRN

## 2014-09-29 NOTE — Progress Notes (Signed)
Subjective:  This chart was scribed for Gary Lin, MD by [facility], medical scribe at [facility].The patient was seen in exam room 14 and the patient's care was started at 4:34 PM.   Patient ID: Gary Parrish, male    DOB: February 09, 1941, 74 y.o.   MRN: [id number] Chief Complaint  Patient presents with  . Knee Pain    right   HPI HPI Comments: Gary Parrish is a 74 y.o. male who presents to [facility] here for a follow up for a recurrent right knee pain acute onset 3 weeks ago. He says the knee will flare up every few years. He denies any trauma to the right knee. Pt states the knee is swollen and warm to the touch. He says the knee aches in bed but the pain is sharp when he ambulates. He was seen here on 09/10/2014 and 3/24 by Dr Linna Darner for this knee pain. The knee was x-rayed and uric acid levels were checked. Aspiration both times wad "dry".He was given a steroid injection the second time which provided some minimal short term relief.  He denies fever.  Patient Active Problem List   Diagnosis Date Noted  . CVA (cerebral vascular accident) 12/05/2012  . ED (erectile dysfunction) 12/05/2012  . Attention deficit disorder 03/10/2011  . HTN (hypertension) 03/10/2011  . FATIGUE 01/01/2010  . CHEST DISCOMFORT 01/01/2010  . HIP PAIN, RIGHT 02/11/2009  . KNEE PAIN, RIGHT 02/11/2009  . GERD 09/16/2008  . DIVERTICULOSIS, COLON 09/16/2008  . PROSTATE CANCER, HX OF 09/16/2008  . SKIN CANCER, HX OF 09/16/2008  . HYPERLIPIDEMIA 05/31/2007  . METABOLIC SYNDROME X 73/22/0254  . 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 UNS ADVRS EFF UNS RX MEDICINAL&BIOLOGICAL SBSTNC 05/31/2007  . COLONIC POLYPS, HX OF 07/21/2006   Past Medical History  Diagnosis Date  . ADD (attention deficit disorder)   . Hyperlipidemia   . Hypertension   . Diverticulosis   . Hemorrhoids   . Prostate cancer   . Lipoma of colon   . Stroke   . Squamous  cell carcinoma     hand  . Cataract    Past Surgical History  Procedure Laterality Date  . Hemorrhoidal banding    . Eye surgery    . Prostate surgery     No Known Allergies Prior to Admission medications   Medication Sig Start Date End Date Taking? Authorizing Provider  amLODipine (NORVASC) 5 MG tablet Take 1 tablet (5 mg total) by mouth daily. 11/20/13  Yes Leandrew Koyanagi, MD  amphetamine-dextroamphetamine (ADDERALL) 15 MG tablet Take 1 tablet by mouth 2 (two) times daily. 09/10/14  Yes Merlinda Frederick McVeigh, PA  aspirin 325 MG tablet Take 325 mg by mouth daily.   Yes Historical Provider, MD  atorvastatin (LIPITOR) 40 MG tablet TAKE ONE TABLET BY MOUTH DAILY 11/20/13  Yes Leandrew Koyanagi, MD  benazepril (LOTENSIN) 40 MG tablet Take 1 tablet (40 mg total) by mouth daily. 11/20/13 11/20/14 Yes Leandrew Koyanagi, MD  diclofenac (VOLTAREN) 75 MG EC tablet Take 1 tablet (75 mg total) by mouth 2 (two) times daily. 09/10/14  Yes Posey Boyer, MD  FIBER PO Take 1 tablet by mouth daily.     Yes Historical Provider, MD  fluticasone Asencion Islam) 50 MCG/ACT nasal spray 2 sprays each nostril at bedtime 06/04/14  Yes Leandrew Koyanagi, MD  glucosamine-chondroitin 500-400 MG tablet Take 1 tablet  by mouth daily. Patient states he takes 1500 daily   Yes Historical Provider, MD  hydrochlorothiazide (MICROZIDE) 12.5 MG capsule Take 1 capsule (12.5 mg total) by mouth daily. PATIENT DUE FOR CHECK UP BY JUNE 09/11/14  Yes Leandrew Koyanagi, MD  Multiple Vitamins-Minerals (VISION VITAMINS PO) Take 1 tablet by mouth daily.     Yes Historical Provider, MD  omeprazole (PRILOSEC) 10 MG capsule Take 10 mg by mouth daily.   Yes Historical Provider, MD  sildenafil (REVATIO) 20 MG tablet Take 20 mg, then 40mg , then 60, mg etc. Until desired therapeutic dosage is achieved. 07/31/14  Yes Leandrew Koyanagi, MD  traMADol (ULTRAM) 50 MG tablet Take 1 tablet (50 mg total) by mouth every 8 (eight) hours as needed. 09/18/14  Yes Posey Boyer, MD  traZODone (DESYREL) 100 MG tablet Take 1 tablet (100 mg total) by mouth daily. PATIENT DUE FOR CHECK UP BY JUNE 09/11/14  Yes Leandrew Koyanagi, MD   Review of Systems  Constitutional: Negative for fever.  Musculoskeletal: Positive for joint swelling, arthralgias and gait problem.      Objective:  BP 130/90 mmHg  Pulse 95  Temp(Src) 97.4 F (36.3 C) (Oral)  Resp 16  Ht 5\' 8"  (1.727 m)  Wt 231 lb (104.781 kg)  BMI 35.13 kg/m2  SpO2 85% Physical Exam  Constitutional: He is oriented to person, place, and time. He appears well-developed and well-nourished. No distress.  HENT:  Head: Normocephalic and atraumatic.  Eyes: Pupils are equal, round, and reactive to light.  Neck: Normal range of motion.  Cardiovascular: Normal rate and regular rhythm.   Pulmonary/Chest: Effort normal. No respiratory distress.  Musculoskeletal: Normal range of motion.  The right knee has a mild effusion with warmth to palpation. With stressors there is no laxity but pain is precipitated under the patellar.  Full extension without pain. Full flexion uncomfortable minimal redness. Painful gait.  Neurological: He is alert and oriented to person, place, and time.  Skin: Skin is warm and dry.  Psychiatric: He has a normal mood and affect. His behavior is normal.  Nursing note and vitals reviewed.     Assessment & Plan:   Chronic recurrent right knee pain Internal derangement right knee  Differential diagnosis should still include gout(he has metabolic syndrome. Uric acid was 6.9 during an attack of pain when it might be artificially low), pseudogout, but more likely is related to degenerative meniscus or osteoarthritis--- set up MRI--- refer to Dr. Teressa Lower to continue tramadol Meds ordered this encounter  Medications  . traMADol (ULTRAM) 50 MG tablet    Sig: Take 1-2 tablets (50-100 mg total) by mouth every 6 (six) hours as needed.    Dispense:  30 tablet    Refill:  1    I have  completed the patient encounter in its entirety as documented by the scribe, with editing by me where necessary. Jannine Abreu P. Laney Pastor, M.D.

## 2014-10-02 ENCOUNTER — Telehealth: Payer: Self-pay

## 2014-10-02 DIAGNOSIS — M25569 Pain in unspecified knee: Secondary | ICD-10-CM | POA: Diagnosis not present

## 2014-10-02 DIAGNOSIS — M25561 Pain in right knee: Secondary | ICD-10-CM | POA: Diagnosis not present

## 2014-10-02 DIAGNOSIS — M1711 Unilateral primary osteoarthritis, right knee: Secondary | ICD-10-CM | POA: Diagnosis not present

## 2014-10-02 NOTE — Telephone Encounter (Signed)
Patient requesting copy of x-ray from 3 weeks ago   Right Knee   Here to pick up

## 2014-10-02 NOTE — Telephone Encounter (Signed)
Sent to xray.

## 2014-10-06 ENCOUNTER — Other Ambulatory Visit: Payer: Self-pay | Admitting: Physician Assistant

## 2014-10-08 ENCOUNTER — Other Ambulatory Visit: Payer: Self-pay | Admitting: Internal Medicine

## 2014-10-08 MED ORDER — AMPHETAMINE-DEXTROAMPHETAMINE 15 MG PO TABS: 15.0000 mg | ORAL_TABLET | Freq: Two times a day (BID) | ORAL | Status: DC

## 2014-10-08 NOTE — Telephone Encounter (Signed)
I called pharm to make sure they have the Rx written 09/29/14 and if pt has already used RF. They advised that pt p/up the add'l RF from that Rx today and they sent the RF req to have ready for next time pt needs the RF.

## 2014-10-08 NOTE — Telephone Encounter (Signed)
Meds ordered this encounter  Medications  . amphetamine-dextroamphetamine (ADDERALL) 15 MG tablet    Sig: Take 1 tablet by mouth 2 (two) times daily.    Dispense:  60 tablet    Refill:  0

## 2014-10-09 NOTE — Telephone Encounter (Signed)
Will notify pt rx is ready.

## 2014-10-11 ENCOUNTER — Ambulatory Visit
Admission: RE | Admit: 2014-10-11 | Discharge: 2014-10-11 | Disposition: A | Discharge status: Home / Self Care | Payer: Medicare Other | Source: Ambulatory Visit | Attending: Internal Medicine | Admitting: Internal Medicine

## 2014-10-11 DIAGNOSIS — M84351A Stress fracture, right femur, initial encounter for fracture: Secondary | ICD-10-CM | POA: Diagnosis not present

## 2014-10-11 DIAGNOSIS — M25561 Pain in right knee: Secondary | ICD-10-CM

## 2014-10-11 DIAGNOSIS — M7121 Synovial cyst of popliteal space [Baker], right knee: Secondary | ICD-10-CM | POA: Diagnosis not present

## 2014-10-11 DIAGNOSIS — M2391 Unspecified internal derangement of right knee: Secondary | ICD-10-CM

## 2014-10-11 DIAGNOSIS — M23321 Other meniscus derangements, posterior horn of medial meniscus, right knee: Secondary | ICD-10-CM | POA: Diagnosis not present

## 2014-10-27 ENCOUNTER — Other Ambulatory Visit: Payer: Self-pay | Admitting: Internal Medicine

## 2014-11-06 ENCOUNTER — Other Ambulatory Visit: Payer: Self-pay | Admitting: Internal Medicine

## 2014-11-07 ENCOUNTER — Other Ambulatory Visit: Payer: Self-pay | Admitting: Internal Medicine

## 2014-11-07 MED ORDER — AMPHETAMINE-DEXTROAMPHETAMINE 15 MG PO TABS: 15.0000 mg | ORAL_TABLET | Freq: Two times a day (BID) | ORAL | Status: DC

## 2014-11-07 NOTE — Telephone Encounter (Signed)
In drawer

## 2014-11-07 NOTE — Telephone Encounter (Signed)
Ready - pt knows through mychart. 

## 2014-11-10 DIAGNOSIS — M1711 Unilateral primary osteoarthritis, right knee: Secondary | ICD-10-CM | POA: Diagnosis not present

## 2014-11-20 ENCOUNTER — Other Ambulatory Visit: Payer: Self-pay | Admitting: Physician Assistant

## 2014-11-21 DIAGNOSIS — M25561 Pain in right knee: Secondary | ICD-10-CM | POA: Diagnosis not present

## 2014-11-21 DIAGNOSIS — M1711 Unilateral primary osteoarthritis, right knee: Secondary | ICD-10-CM | POA: Diagnosis not present

## 2014-12-02 DIAGNOSIS — M1711 Unilateral primary osteoarthritis, right knee: Secondary | ICD-10-CM | POA: Diagnosis not present

## 2014-12-06 ENCOUNTER — Telehealth: Payer: Self-pay | Admitting: Physician Assistant

## 2014-12-08 ENCOUNTER — Other Ambulatory Visit: Payer: Self-pay | Admitting: Physician Assistant

## 2014-12-08 MED ORDER — AMPHETAMINE-DEXTROAMPHETAMINE 15 MG PO TABS: 15.0000 mg | ORAL_TABLET | Freq: Two times a day (BID) | ORAL | Status: DC

## 2014-12-08 NOTE — Telephone Encounter (Signed)
Done.  Pt knows through mychart 

## 2014-12-08 NOTE — Telephone Encounter (Signed)
Pt called and I advised him by phone.

## 2014-12-09 DIAGNOSIS — C61 Malignant neoplasm of prostate: Secondary | ICD-10-CM | POA: Diagnosis not present

## 2014-12-11 ENCOUNTER — Other Ambulatory Visit: Payer: Self-pay | Admitting: Physician Assistant

## 2014-12-15 DIAGNOSIS — C61 Malignant neoplasm of prostate: Secondary | ICD-10-CM | POA: Diagnosis not present

## 2014-12-15 DIAGNOSIS — N3941 Urge incontinence: Secondary | ICD-10-CM | POA: Diagnosis not present

## 2014-12-17 DIAGNOSIS — R29898 Other symptoms and signs involving the musculoskeletal system: Secondary | ICD-10-CM | POA: Diagnosis not present

## 2014-12-17 DIAGNOSIS — Z7409 Other reduced mobility: Secondary | ICD-10-CM | POA: Diagnosis not present

## 2014-12-17 DIAGNOSIS — M25561 Pain in right knee: Secondary | ICD-10-CM | POA: Diagnosis not present

## 2014-12-17 DIAGNOSIS — M1711 Unilateral primary osteoarthritis, right knee: Secondary | ICD-10-CM | POA: Diagnosis not present

## 2014-12-17 DIAGNOSIS — Z5189 Encounter for other specified aftercare: Secondary | ICD-10-CM | POA: Diagnosis not present

## 2014-12-22 DIAGNOSIS — M25561 Pain in right knee: Secondary | ICD-10-CM | POA: Diagnosis not present

## 2014-12-22 DIAGNOSIS — Z5189 Encounter for other specified aftercare: Secondary | ICD-10-CM | POA: Diagnosis not present

## 2014-12-22 DIAGNOSIS — R29898 Other symptoms and signs involving the musculoskeletal system: Secondary | ICD-10-CM | POA: Diagnosis not present

## 2014-12-22 DIAGNOSIS — M1711 Unilateral primary osteoarthritis, right knee: Secondary | ICD-10-CM | POA: Diagnosis not present

## 2014-12-22 DIAGNOSIS — Z7409 Other reduced mobility: Secondary | ICD-10-CM | POA: Diagnosis not present

## 2014-12-31 ENCOUNTER — Encounter: Payer: Self-pay | Admitting: Internal Medicine

## 2014-12-31 ENCOUNTER — Ambulatory Visit (INDEPENDENT_AMBULATORY_CARE_PROVIDER_SITE_OTHER): Payer: Medicare Other | Admitting: Internal Medicine

## 2014-12-31 VITALS — BP 145/84 | HR 74 | Temp 97.9°F | Resp 16 | Ht 68.5 in | Wt 221.0 lb

## 2014-12-31 DIAGNOSIS — R29898 Other symptoms and signs involving the musculoskeletal system: Secondary | ICD-10-CM | POA: Diagnosis not present

## 2014-12-31 DIAGNOSIS — Z0000 Encounter for general adult medical examination without abnormal findings: Secondary | ICD-10-CM | POA: Diagnosis not present

## 2014-12-31 DIAGNOSIS — I10 Essential (primary) hypertension: Secondary | ICD-10-CM | POA: Diagnosis not present

## 2014-12-31 DIAGNOSIS — M25561 Pain in right knee: Secondary | ICD-10-CM | POA: Diagnosis not present

## 2014-12-31 DIAGNOSIS — F909 Attention-deficit hyperactivity disorder, unspecified type: Secondary | ICD-10-CM

## 2014-12-31 DIAGNOSIS — Z7409 Other reduced mobility: Secondary | ICD-10-CM | POA: Diagnosis not present

## 2014-12-31 DIAGNOSIS — M1711 Unilateral primary osteoarthritis, right knee: Secondary | ICD-10-CM | POA: Diagnosis not present

## 2014-12-31 DIAGNOSIS — N529 Male erectile dysfunction, unspecified: Secondary | ICD-10-CM

## 2014-12-31 DIAGNOSIS — E782 Mixed hyperlipidemia: Secondary | ICD-10-CM | POA: Diagnosis not present

## 2014-12-31 DIAGNOSIS — F988 Other specified behavioral and emotional disorders with onset usually occurring in childhood and adolescence: Secondary | ICD-10-CM

## 2014-12-31 LAB — CBC WITH DIFFERENTIAL/PLATELET
Basophils Absolute: 0.1 10*3/uL (ref 0.0–0.1)
Basophils Relative: 1 % (ref 0–1)
EOS ABS: 0.1 10*3/uL (ref 0.0–0.7)
Eosinophils Relative: 1 % (ref 0–5)
HEMATOCRIT: 46 % (ref 39.0–52.0)
HEMOGLOBIN: 16.1 g/dL (ref 13.0–17.0)
LYMPHS ABS: 3.3 10*3/uL (ref 0.7–4.0)
LYMPHS PCT: 33 % (ref 12–46)
MCH: 31.2 pg (ref 26.0–34.0)
MCHC: 35 g/dL (ref 30.0–36.0)
MCV: 89.1 fL (ref 78.0–100.0)
MONO ABS: 0.9 10*3/uL (ref 0.1–1.0)
MPV: 9.8 fL (ref 8.6–12.4)
Monocytes Relative: 9 % (ref 3–12)
Neutro Abs: 5.5 10*3/uL (ref 1.7–7.7)
Neutrophils Relative %: 56 % (ref 43–77)
Platelets: 345 10*3/uL (ref 150–400)
RBC: 5.16 MIL/uL (ref 4.22–5.81)
RDW: 13.4 % (ref 11.5–15.5)
WBC: 9.9 10*3/uL (ref 4.0–10.5)

## 2014-12-31 LAB — LIPID PANEL
CHOLESTEROL: 134 mg/dL (ref 0–200)
HDL: 57 mg/dL (ref >=40)
LDL Cholesterol: 55 mg/dL (ref 0–99)
Total CHOL/HDL Ratio: 2.4 Ratio
Triglycerides: 109 mg/dL (ref <150)
VLDL: 22 mg/dL (ref 0–40)

## 2014-12-31 LAB — COMPREHENSIVE METABOLIC PANEL
ALT: 26 U/L (ref 0–53)
AST: 22 U/L (ref 0–37)
Albumin: 4.3 g/dL (ref 3.5–5.2)
Alkaline Phosphatase: 112 U/L (ref 39–117)
BILIRUBIN TOTAL: 1 mg/dL (ref 0.2–1.2)
BUN: 17 mg/dL (ref 6–23)
CO2: 25 meq/L (ref 19–32)
Calcium: 10.3 mg/dL (ref 8.4–10.5)
Chloride: 100 mEq/L (ref 96–112)
Creat: 0.95 mg/dL (ref 0.50–1.35)
Glucose, Bld: 83 mg/dL (ref 70–99)
Potassium: 4.6 mEq/L (ref 3.5–5.3)
Sodium: 139 mEq/L (ref 135–145)
Total Protein: 7.3 g/dL (ref 6.0–8.3)

## 2014-12-31 MED ORDER — AMPHETAMINE-DEXTROAMPHETAMINE 15 MG PO TABS: 15.0000 mg | ORAL_TABLET | Freq: Two times a day (BID) | ORAL | Status: DC

## 2014-12-31 MED ORDER — ATORVASTATIN CALCIUM 40 MG PO TABS: 40.0000 mg | ORAL_TABLET | Freq: Every day | ORAL | Status: DC

## 2014-12-31 MED ORDER — FLUTICASONE PROPIONATE 50 MCG/ACT NA SUSP: NASAL | Status: DC

## 2014-12-31 MED ORDER — HYDROCHLOROTHIAZIDE 12.5 MG PO CAPS: 12.5000 mg | ORAL_CAPSULE | Freq: Every day | ORAL | Status: DC

## 2014-12-31 MED ORDER — TRAZODONE HCL 100 MG PO TABS: 100.0000 mg | ORAL_TABLET | Freq: Every day | ORAL | Status: DC

## 2014-12-31 MED ORDER — BENAZEPRIL HCL 40 MG PO TABS: ORAL_TABLET | ORAL | Status: DC

## 2014-12-31 MED ORDER — AMLODIPINE BESYLATE 5 MG PO TABS: 5.0000 mg | ORAL_TABLET | Freq: Every day | ORAL | Status: DC

## 2014-12-31 NOTE — Progress Notes (Signed)
Subjective:    Patient ID: Gary Parrish, male    DOB: Mar 31, 1941, 74 y.o.   MRN: [id number]  HPI  Chief Complaint  Patient presents with  . Annual Exam  . Medication Refill    Patient Active Problem List   Diagnosis Date Noted  . CVA (cerebral vascular accident) 12/05/2012  . ED (erectile dysfunction) 12/05/2012  . Attention deficit disorder 03/10/2011  . HTN (hypertension) 03/10/2011  . FATIGUE 01/01/2010  . CHEST DISCOMFORT 01/01/2010  . HIP PAIN, RIGHT 02/11/2009  . KNEE PAIN, RIGHT 02/11/2009  . GERD 09/16/2008  . DIVERTICULOSIS, COLON 09/16/2008  . PROSTATE CANCER, HX OF 09/16/2008  . SKIN CANCER, HX OF 09/16/2008  . HYPERLIPIDEMIA 05/31/2007  . METABOLIC SYNDROME X 51/70/0174  . 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 UNS ADVRS EFF UNS RX MEDICINAL&BIOLOGICAL SBSTNC 05/31/2007  . COLONIC POLYPS, HX OF 07/21/2006  ---u freq/occas incont--urol eval ongoing--s/p pros ca viagra not helpful any more--thinking MUSE Adderall = good results Still works 4hr/d GF liv toge weekends exwife stepson embez-long term stressors Takes traz if wakes early No resid now fro CVA Ready for knee surgery!!!!   Review of Systems 14pt ros by form adds no addit factors    Objective:   Physical Exam  Constitutional: He is oriented to person, place, and time. He appears well-developed and well-nourished.  HENT:  Head: Normocephalic and atraumatic.  Right Ear: Hearing, tympanic membrane, external ear and ear canal normal.  Left Ear: Hearing, tympanic membrane, external ear and ear canal normal.  Nose: Nose normal.  Mouth/Throat: Uvula is midline, oropharynx is clear and moist and mucous membranes are normal.  Eyes: Conjunctivae, EOM and lids are normal. Pupils are equal, round, and reactive to light. Right eye exhibits no discharge. Left eye exhibits no discharge. No scleral icterus.  Neck: Trachea normal and normal range of motion. Neck supple. Carotid bruit is  not present.  Cardiovascular: Normal rate, regular rhythm, normal heart sounds, intact distal pulses and normal pulses.   No murmur heard. Pulmonary/Chest: Effort normal and breath sounds normal. No respiratory distress. He has no wheezes. He has no rhonchi. He has no rales.  Abdominal: Soft. Normal appearance and bowel sounds are normal. He exhibits no abdominal bruit. There is no tenderness.  Musculoskeletal: Normal range of motion. He exhibits no edema or tenderness.  Lymphadenopathy:       Head (right side): No submental, no submandibular, no tonsillar, no preauricular, no posterior auricular and no occipital adenopathy present.       Head (left side): No submental, no submandibular, no tonsillar, no preauricular, no posterior auricular and no occipital adenopathy present.    He has no cervical adenopathy.  Neurological: He is alert and oriented to person, place, and time. He has normal strength and normal reflexes. No cranial nerve deficit or sensory deficit. Coordination and gait normal.  Skin: Skin is warm, dry and intact. No lesion and no rash noted.  Psychiatric: He has a normal mood and affect. His speech is normal and behavior is normal. Judgment and thought content normal.  R knee swollen w/ painfl rom        Assessment & Plan:  Annual physical exam  Attention deficit disorder  Erectile dysfunction, unspecified erectile dysfunction type - Plan: Testosterone  Essential hypertension - Plan: CBC with Differential/Platelet, Comprehensive metabolic panel  HYPERLIPIDEMIA - Plan: Lipid panel  Pain in joint, lower leg, right  Meds ordered this encounter  Medications  .  amLODipine (NORVASC) 5 MG tablet    Sig: Take 1 tablet (5 mg total) by mouth daily.    Dispense:  90 tablet    Refill:  3  . atorvastatin (LIPITOR) 40 MG tablet    Sig: Take 1 tablet (40 mg total) by mouth daily.    Dispense:  90 tablet    Refill:  3  . benazepril (LOTENSIN) 40 MG tablet    Sig: TAKE ONE  TABLET BY MOUTH DAILY.    Dispense:  90 tablet    Refill:  3    This prescription was filled today. Any refills authorized will be placed on file.  . fluticasone (FLONASE) 50 MCG/ACT nasal spray    Sig: 2 sprays each nostril at bedtime    Dispense:  16 g    Refill:  6  . traZODone (DESYREL) 100 MG tablet    Sig: Take 1 tablet (100 mg total) by mouth daily.    Dispense:  90 tablet    Refill:  3    This prescription was filled today. Any refills authorized will be placed on file.  . hydrochlorothiazide (MICROZIDE) 12.5 MG capsule    Sig: Take 1 capsule (12.5 mg total) by mouth daily.    Dispense:  30 capsule    Refill:  0    This prescription was filled today. Any refills authorized will be placed on file.  Marland Kitchen amphetamine-dextroamphetamine (ADDERALL) 15 MG tablet    Sig: Take 1 tablet by mouth 2 (two) times daily.    Dispense:  60 tablet    Refill:  0  . amphetamine-dextroamphetamine (ADDERALL) 15 MG tablet    Sig: Take 1 tablet by mouth 2 (two) times daily. For 30d after signed    Dispense:  60 tablet    Refill:  0  . amphetamine-dextroamphetamine (ADDERALL) 15 MG tablet    Sig: Take 1 tablet by mouth 2 (two) times daily. For 60d after signed    Dispense:  60 tablet    Refill:  0    Addend-labs7/8 Results for orders placed or performed in visit on 12/31/14  CBC with Differential/Platelet  Result Value Ref Range   WBC 9.9 4.0 - 10.5 K/uL   RBC 5.16 4.22 - 5.81 MIL/uL   Hemoglobin 16.1 13.0 - 17.0 g/dL   HCT 46.0 39.0 - 52.0 %   MCV 89.1 78.0 - 100.0 fL   MCH 31.2 26.0 - 34.0 pg   MCHC 35.0 30.0 - 36.0 g/dL   RDW 13.4 11.5 - 15.5 %   Platelets 345 150 - 400 K/uL   MPV 9.8 8.6 - 12.4 fL   Neutrophils Relative % 56 43 - 77 %   Neutro Abs 5.5 1.7 - 7.7 K/uL   Lymphocytes Relative 33 12 - 46 %   Lymphs Abs 3.3 0.7 - 4.0 K/uL   Monocytes Relative 9 3 - 12 %   Monocytes Absolute 0.9 0.1 - 1.0 K/uL   Eosinophils Relative 1 0 - 5 %   Eosinophils Absolute 0.1 0.0 - 0.7 K/uL    Basophils Relative 1 0 - 1 %   Basophils Absolute 0.1 0.0 - 0.1 K/uL   Smear Review Criteria for review not met   Comprehensive metabolic panel  Result Value Ref Range   Sodium 139 135 - 145 mEq/L   Potassium 4.6 3.5 - 5.3 mEq/L   Chloride 100 96 - 112 mEq/L   CO2 25 19 - 32 mEq/L   Glucose, Bld 83 70 -  99 mg/dL   BUN 17 6 - 23 mg/dL   Creat 0.95 0.50 - 1.35 mg/dL   Total Bilirubin 1.0 0.2 - 1.2 mg/dL   Alkaline Phosphatase 112 39 - 117 U/L   AST 22 0 - 37 U/L   ALT 26 0 - 53 U/L   Total Protein 7.3 6.0 - 8.3 g/dL   Albumin 4.3 3.5 - 5.2 g/dL   Calcium 10.3 8.4 - 10.5 mg/dL  Lipid panel  Result Value Ref Range   Cholesterol 134 0 - 200 mg/dL   Triglycerides 109 <150 mg/dL   HDL 57 >=40 mg/dL   Total CHOL/HDL Ratio 2.4 Ratio   VLDL 22 0 - 40 mg/dL   LDL Cholesterol 55 0 - 99 mg/dL  Testosterone  Result Value Ref Range   Testosterone 419 300 - 890 ng/dL

## 2015-01-01 LAB — TESTOSTERONE: Testosterone: 419 ng/dL (ref 300–890)

## 2015-01-02 ENCOUNTER — Encounter: Payer: Self-pay | Admitting: Internal Medicine

## 2015-01-02 DIAGNOSIS — M25561 Pain in right knee: Secondary | ICD-10-CM | POA: Diagnosis not present

## 2015-01-02 DIAGNOSIS — Z7409 Other reduced mobility: Secondary | ICD-10-CM | POA: Diagnosis not present

## 2015-01-02 DIAGNOSIS — M1711 Unilateral primary osteoarthritis, right knee: Secondary | ICD-10-CM | POA: Diagnosis not present

## 2015-01-02 DIAGNOSIS — R29898 Other symptoms and signs involving the musculoskeletal system: Secondary | ICD-10-CM | POA: Diagnosis not present

## 2015-01-05 ENCOUNTER — Other Ambulatory Visit: Payer: Self-pay | Admitting: Physician Assistant

## 2015-01-05 ENCOUNTER — Other Ambulatory Visit: Payer: Self-pay | Admitting: Internal Medicine

## 2015-01-06 DIAGNOSIS — R29898 Other symptoms and signs involving the musculoskeletal system: Secondary | ICD-10-CM | POA: Diagnosis not present

## 2015-01-06 DIAGNOSIS — M1711 Unilateral primary osteoarthritis, right knee: Secondary | ICD-10-CM | POA: Diagnosis not present

## 2015-01-06 DIAGNOSIS — M25561 Pain in right knee: Secondary | ICD-10-CM | POA: Diagnosis not present

## 2015-01-06 DIAGNOSIS — Z7409 Other reduced mobility: Secondary | ICD-10-CM | POA: Diagnosis not present

## 2015-01-14 MED ORDER — SILDENAFIL CITRATE 20 MG PO TABS: ORAL_TABLET | ORAL | Status: DC

## 2015-01-15 DIAGNOSIS — M1711 Unilateral primary osteoarthritis, right knee: Secondary | ICD-10-CM | POA: Diagnosis not present

## 2015-01-15 DIAGNOSIS — M2241 Chondromalacia patellae, right knee: Secondary | ICD-10-CM | POA: Diagnosis not present

## 2015-01-16 DIAGNOSIS — Z7409 Other reduced mobility: Secondary | ICD-10-CM | POA: Diagnosis not present

## 2015-01-16 DIAGNOSIS — R29898 Other symptoms and signs involving the musculoskeletal system: Secondary | ICD-10-CM | POA: Diagnosis not present

## 2015-01-16 DIAGNOSIS — M25561 Pain in right knee: Secondary | ICD-10-CM | POA: Diagnosis not present

## 2015-01-16 DIAGNOSIS — M1711 Unilateral primary osteoarthritis, right knee: Secondary | ICD-10-CM | POA: Diagnosis not present

## 2015-01-19 DIAGNOSIS — Z7409 Other reduced mobility: Secondary | ICD-10-CM | POA: Diagnosis not present

## 2015-01-19 DIAGNOSIS — R29898 Other symptoms and signs involving the musculoskeletal system: Secondary | ICD-10-CM | POA: Diagnosis not present

## 2015-01-19 DIAGNOSIS — M1711 Unilateral primary osteoarthritis, right knee: Secondary | ICD-10-CM | POA: Diagnosis not present

## 2015-01-19 DIAGNOSIS — M25561 Pain in right knee: Secondary | ICD-10-CM | POA: Diagnosis not present

## 2015-01-23 DIAGNOSIS — M1711 Unilateral primary osteoarthritis, right knee: Secondary | ICD-10-CM | POA: Diagnosis not present

## 2015-01-23 DIAGNOSIS — R29898 Other symptoms and signs involving the musculoskeletal system: Secondary | ICD-10-CM | POA: Diagnosis not present

## 2015-01-23 DIAGNOSIS — M25561 Pain in right knee: Secondary | ICD-10-CM | POA: Diagnosis not present

## 2015-01-23 DIAGNOSIS — Z7409 Other reduced mobility: Secondary | ICD-10-CM | POA: Diagnosis not present

## 2015-01-26 DIAGNOSIS — M1711 Unilateral primary osteoarthritis, right knee: Secondary | ICD-10-CM | POA: Diagnosis not present

## 2015-01-26 DIAGNOSIS — G8929 Other chronic pain: Secondary | ICD-10-CM | POA: Diagnosis not present

## 2015-01-26 DIAGNOSIS — Z7409 Other reduced mobility: Secondary | ICD-10-CM | POA: Diagnosis not present

## 2015-01-26 DIAGNOSIS — M6281 Muscle weakness (generalized): Secondary | ICD-10-CM | POA: Diagnosis not present

## 2015-01-26 DIAGNOSIS — M25561 Pain in right knee: Secondary | ICD-10-CM | POA: Diagnosis not present

## 2015-01-26 DIAGNOSIS — R29898 Other symptoms and signs involving the musculoskeletal system: Secondary | ICD-10-CM | POA: Diagnosis not present

## 2015-01-30 DIAGNOSIS — M25561 Pain in right knee: Secondary | ICD-10-CM | POA: Diagnosis not present

## 2015-01-30 DIAGNOSIS — M1711 Unilateral primary osteoarthritis, right knee: Secondary | ICD-10-CM | POA: Diagnosis not present

## 2015-01-30 DIAGNOSIS — G8929 Other chronic pain: Secondary | ICD-10-CM | POA: Diagnosis not present

## 2015-01-30 DIAGNOSIS — R29898 Other symptoms and signs involving the musculoskeletal system: Secondary | ICD-10-CM | POA: Diagnosis not present

## 2015-01-30 DIAGNOSIS — M6281 Muscle weakness (generalized): Secondary | ICD-10-CM | POA: Diagnosis not present

## 2015-01-30 DIAGNOSIS — Z7409 Other reduced mobility: Secondary | ICD-10-CM | POA: Diagnosis not present

## 2015-02-02 DIAGNOSIS — Z7409 Other reduced mobility: Secondary | ICD-10-CM | POA: Diagnosis not present

## 2015-02-02 DIAGNOSIS — R29898 Other symptoms and signs involving the musculoskeletal system: Secondary | ICD-10-CM | POA: Diagnosis not present

## 2015-02-02 DIAGNOSIS — G8929 Other chronic pain: Secondary | ICD-10-CM | POA: Diagnosis not present

## 2015-02-02 DIAGNOSIS — M1711 Unilateral primary osteoarthritis, right knee: Secondary | ICD-10-CM | POA: Diagnosis not present

## 2015-02-02 DIAGNOSIS — M6281 Muscle weakness (generalized): Secondary | ICD-10-CM | POA: Diagnosis not present

## 2015-02-02 DIAGNOSIS — M25561 Pain in right knee: Secondary | ICD-10-CM | POA: Diagnosis not present

## 2015-02-06 DIAGNOSIS — M6281 Muscle weakness (generalized): Secondary | ICD-10-CM | POA: Diagnosis not present

## 2015-02-06 DIAGNOSIS — M25561 Pain in right knee: Secondary | ICD-10-CM | POA: Diagnosis not present

## 2015-02-06 DIAGNOSIS — Z7409 Other reduced mobility: Secondary | ICD-10-CM | POA: Diagnosis not present

## 2015-02-06 DIAGNOSIS — G8929 Other chronic pain: Secondary | ICD-10-CM | POA: Diagnosis not present

## 2015-02-06 DIAGNOSIS — R29898 Other symptoms and signs involving the musculoskeletal system: Secondary | ICD-10-CM | POA: Diagnosis not present

## 2015-02-06 DIAGNOSIS — M1711 Unilateral primary osteoarthritis, right knee: Secondary | ICD-10-CM | POA: Diagnosis not present

## 2015-02-09 DIAGNOSIS — Z7409 Other reduced mobility: Secondary | ICD-10-CM | POA: Diagnosis not present

## 2015-02-09 DIAGNOSIS — M6281 Muscle weakness (generalized): Secondary | ICD-10-CM | POA: Diagnosis not present

## 2015-02-09 DIAGNOSIS — M25561 Pain in right knee: Secondary | ICD-10-CM | POA: Diagnosis not present

## 2015-02-09 DIAGNOSIS — M1711 Unilateral primary osteoarthritis, right knee: Secondary | ICD-10-CM | POA: Diagnosis not present

## 2015-02-09 DIAGNOSIS — R29898 Other symptoms and signs involving the musculoskeletal system: Secondary | ICD-10-CM | POA: Diagnosis not present

## 2015-02-09 DIAGNOSIS — G8929 Other chronic pain: Secondary | ICD-10-CM | POA: Diagnosis not present

## 2015-02-16 DIAGNOSIS — G8929 Other chronic pain: Secondary | ICD-10-CM | POA: Diagnosis not present

## 2015-02-16 DIAGNOSIS — M25561 Pain in right knee: Secondary | ICD-10-CM | POA: Diagnosis not present

## 2015-02-16 DIAGNOSIS — R29898 Other symptoms and signs involving the musculoskeletal system: Secondary | ICD-10-CM | POA: Diagnosis not present

## 2015-02-16 DIAGNOSIS — M6281 Muscle weakness (generalized): Secondary | ICD-10-CM | POA: Diagnosis not present

## 2015-02-16 DIAGNOSIS — Z7409 Other reduced mobility: Secondary | ICD-10-CM | POA: Diagnosis not present

## 2015-02-16 DIAGNOSIS — M1711 Unilateral primary osteoarthritis, right knee: Secondary | ICD-10-CM | POA: Diagnosis not present

## 2015-02-18 ENCOUNTER — Other Ambulatory Visit: Payer: Self-pay | Admitting: Internal Medicine

## 2015-02-18 DIAGNOSIS — G8929 Other chronic pain: Secondary | ICD-10-CM | POA: Diagnosis not present

## 2015-02-18 DIAGNOSIS — M6281 Muscle weakness (generalized): Secondary | ICD-10-CM | POA: Diagnosis not present

## 2015-02-18 DIAGNOSIS — R29898 Other symptoms and signs involving the musculoskeletal system: Secondary | ICD-10-CM | POA: Diagnosis not present

## 2015-02-18 DIAGNOSIS — M1711 Unilateral primary osteoarthritis, right knee: Secondary | ICD-10-CM | POA: Diagnosis not present

## 2015-02-18 DIAGNOSIS — Z7409 Other reduced mobility: Secondary | ICD-10-CM | POA: Diagnosis not present

## 2015-02-18 DIAGNOSIS — M25561 Pain in right knee: Secondary | ICD-10-CM | POA: Diagnosis not present

## 2015-03-21 ENCOUNTER — Telehealth: Payer: Self-pay | Admitting: Internal Medicine

## 2015-03-23 DIAGNOSIS — M6281 Muscle weakness (generalized): Secondary | ICD-10-CM | POA: Diagnosis not present

## 2015-03-23 DIAGNOSIS — M1711 Unilateral primary osteoarthritis, right knee: Secondary | ICD-10-CM | POA: Diagnosis not present

## 2015-03-23 DIAGNOSIS — G8929 Other chronic pain: Secondary | ICD-10-CM | POA: Diagnosis not present

## 2015-03-23 DIAGNOSIS — Z7409 Other reduced mobility: Secondary | ICD-10-CM | POA: Diagnosis not present

## 2015-03-23 DIAGNOSIS — M25561 Pain in right knee: Secondary | ICD-10-CM | POA: Diagnosis not present

## 2015-03-24 MED ORDER — AMPHETAMINE-DEXTROAMPHETAMINE 15 MG PO TABS: 15.0000 mg | ORAL_TABLET | Freq: Two times a day (BID) | ORAL | Status: DC

## 2015-03-24 NOTE — Addendum Note (Signed)
Addended by: Leandrew Koyanagi on: 03/24/2015 10:29 AM   Modules accepted: Orders

## 2015-03-24 NOTE — Telephone Encounter (Signed)
Meds ordered this encounter  Medications  . amphetamine-dextroamphetamine (ADDERALL) 15 MG tablet    Sig: Take 1 tablet by mouth 2 (two) times daily.    Dispense:  60 tablet    Refill:  0  . amphetamine-dextroamphetamine (ADDERALL) 15 MG tablet    Sig: Take 1 tablet by mouth 2 (two) times daily. For 30d after signed    Dispense:  60 tablet    Refill:  0  . amphetamine-dextroamphetamine (ADDERALL) 15 MG tablet    Sig: Take 1 tablet by mouth 2 (two) times daily. For 60d after signed    Dispense:  60 tablet    Refill:  0

## 2015-03-25 NOTE — Telephone Encounter (Signed)
Called pt and spoke with him. Informed pt that prescriptions for adderall can now be picked up from the office.

## 2015-03-26 DIAGNOSIS — M1711 Unilateral primary osteoarthritis, right knee: Secondary | ICD-10-CM | POA: Diagnosis not present

## 2015-03-26 DIAGNOSIS — M25561 Pain in right knee: Secondary | ICD-10-CM | POA: Diagnosis not present

## 2015-03-26 DIAGNOSIS — M6281 Muscle weakness (generalized): Secondary | ICD-10-CM | POA: Diagnosis not present

## 2015-03-26 DIAGNOSIS — Z7409 Other reduced mobility: Secondary | ICD-10-CM | POA: Diagnosis not present

## 2015-03-26 DIAGNOSIS — G8929 Other chronic pain: Secondary | ICD-10-CM | POA: Diagnosis not present

## 2015-04-02 DIAGNOSIS — M6281 Muscle weakness (generalized): Secondary | ICD-10-CM | POA: Diagnosis not present

## 2015-04-02 DIAGNOSIS — G8929 Other chronic pain: Secondary | ICD-10-CM | POA: Diagnosis not present

## 2015-04-02 DIAGNOSIS — M1711 Unilateral primary osteoarthritis, right knee: Secondary | ICD-10-CM | POA: Diagnosis not present

## 2015-04-02 DIAGNOSIS — M25561 Pain in right knee: Secondary | ICD-10-CM | POA: Diagnosis not present

## 2015-04-02 DIAGNOSIS — Z7409 Other reduced mobility: Secondary | ICD-10-CM | POA: Diagnosis not present

## 2015-04-03 ENCOUNTER — Encounter: Payer: Self-pay | Admitting: Internal Medicine

## 2015-04-03 ENCOUNTER — Ambulatory Visit (INDEPENDENT_AMBULATORY_CARE_PROVIDER_SITE_OTHER): Payer: Medicare Other | Admitting: Internal Medicine

## 2015-04-03 VITALS — BP 130/72 | HR 76 | Temp 97.7°F | Resp 16 | Ht 68.5 in | Wt 226.0 lb

## 2015-04-03 DIAGNOSIS — M25561 Pain in right knee: Secondary | ICD-10-CM | POA: Diagnosis not present

## 2015-04-03 DIAGNOSIS — I10 Essential (primary) hypertension: Secondary | ICD-10-CM

## 2015-04-03 DIAGNOSIS — E782 Mixed hyperlipidemia: Secondary | ICD-10-CM | POA: Diagnosis not present

## 2015-04-03 DIAGNOSIS — F909 Attention-deficit hyperactivity disorder, unspecified type: Secondary | ICD-10-CM

## 2015-04-03 DIAGNOSIS — F988 Other specified behavioral and emotional disorders with onset usually occurring in childhood and adolescence: Secondary | ICD-10-CM

## 2015-04-03 DIAGNOSIS — Z23 Encounter for immunization: Secondary | ICD-10-CM

## 2015-04-03 MED ORDER — AMPHETAMINE-DEXTROAMPHETAMINE 20 MG PO TABS: 20.0000 mg | ORAL_TABLET | Freq: Two times a day (BID) | ORAL | Status: DC

## 2015-04-03 NOTE — Progress Notes (Signed)
   Subjective:    Patient ID: Gary Parrish, male    DOB: Dec 13, 1940, 74 y.o.   MRN: [id number]  HPI here for follow-up Chief Complaint  Patient presents with  . Follow-up  . from CPE, pt would like to discuss results of cholesterol profile because he did not understand some of the words on the page that were mailed to him   . would like dosage increase on Adderall 15 mg twice a day because it's duration of effect is only 3 hours now and he is out of medicine for his work day is over. 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 He is also very active outdoors when he is not working.    Considering knee replacement on the right: [facility] [facility]--R Knee needs surgery but they did not want to do partial 2nd opinion [facility] Dr Hassell Done PT there already and is 20% better They worked on L leg as well(resid weakness from stroke)  Review of Systems Loss of smell-? When= has noted over recent 6 months a few episodes were other people smell things that he did not. He denies headache or vision change. He is had no new medications other than allergy medications spray that might affect this. He is had no injuries. His appetite and his taste are good. There are many things he can smell.    flonase/allergy meds-zyrtec fall and spring  No chest pain or palpitations No genitourinary problems and continues to use revatio GERD controlled No side effects Lipitor Blood pressures okay at home  Objective:   Physical Exam BP 130/72 mmHg  Pulse 76  Temp(Src) 97.7 F (36.5 C) (Oral)  Resp 16  Ht 5' 8.5" (1.74 m)  Wt 266 lb 9.6 oz (120.929 kg)  BMI 39.94 kg/m2  SpO2 94% Wt Readings from Last 3 Encounters:  04/03/15 266 lb 9.6 oz (120.929 kg)---entered incorrectly---was 226  12/31/14 221 lb (100.245 kg)  09/29/14 231 lb (104.781 kg)   HEENT clear Heart reg  Lungs clear extr cl w/ good pulses R knee as before Mood good/aff appr/TC wnl      Assessment &  Plan:  Attention deficit disorder--incr to 20mg  add bid  Flu vaccine need - Plan: Flu Vaccine QUAD 36+ mos IM  HYPERLIPIDEMIA--well controlled!!!!!  Right knee pain--surgery at [facility]  Essential hypertension-contr  ?loss of smell--will follow for any new losses before imaging/further testing   Meds ordered this encounter  Medications  . amphetamine-dextroamphetamine (ADDERALL) 20 MG tablet    Sig: Take 1 tablet (20 mg total) by mouth 2 (two) times daily.    Dispense:  60 tablet    Refill:  0  . amphetamine-dextroamphetamine (ADDERALL) 20 MG tablet    Sig: Take 1 tablet (20 mg total) by mouth 2 (two) times daily. For 30d after signed    Dispense:  60 tablet    Refill:  0  . amphetamine-dextroamphetamine (ADDERALL) 20 MG tablet    Sig: Take 1 tablet (20 mg total) by mouth 2 (two) times daily. For 60d after signed    Dispense:  60 tablet    Refill:  0   Call 3/f-u 6

## 2015-04-07 DIAGNOSIS — M6281 Muscle weakness (generalized): Secondary | ICD-10-CM | POA: Diagnosis not present

## 2015-04-07 DIAGNOSIS — M1711 Unilateral primary osteoarthritis, right knee: Secondary | ICD-10-CM | POA: Diagnosis not present

## 2015-04-07 DIAGNOSIS — G8929 Other chronic pain: Secondary | ICD-10-CM | POA: Diagnosis not present

## 2015-04-07 DIAGNOSIS — M25561 Pain in right knee: Secondary | ICD-10-CM | POA: Diagnosis not present

## 2015-04-07 DIAGNOSIS — Z7409 Other reduced mobility: Secondary | ICD-10-CM | POA: Diagnosis not present

## 2015-04-16 DIAGNOSIS — Z961 Presence of intraocular lens: Secondary | ICD-10-CM | POA: Diagnosis not present

## 2015-04-16 DIAGNOSIS — H04123 Dry eye syndrome of bilateral lacrimal glands: Secondary | ICD-10-CM | POA: Diagnosis not present

## 2015-04-16 DIAGNOSIS — H353132 Nonexudative age-related macular degeneration, bilateral, intermediate dry stage: Secondary | ICD-10-CM | POA: Diagnosis not present

## 2015-05-08 DIAGNOSIS — I10 Essential (primary) hypertension: Secondary | ICD-10-CM | POA: Diagnosis not present

## 2015-05-08 DIAGNOSIS — M1711 Unilateral primary osteoarthritis, right knee: Secondary | ICD-10-CM | POA: Diagnosis not present

## 2015-05-18 ENCOUNTER — Emergency Department (HOSPITAL_COMMUNITY)
Admission: EM | Admit: 2015-05-18 | Discharge: 2015-05-18 | Disposition: A | Discharge status: Home / Self Care | Payer: No Typology Code available for payment source | Attending: Emergency Medicine | Admitting: Emergency Medicine

## 2015-05-18 ENCOUNTER — Emergency Department (HOSPITAL_COMMUNITY): Payer: No Typology Code available for payment source

## 2015-05-18 ENCOUNTER — Encounter (HOSPITAL_COMMUNITY): Payer: Self-pay | Admitting: Emergency Medicine

## 2015-05-18 DIAGNOSIS — S60511A Abrasion of right hand, initial encounter: Secondary | ICD-10-CM | POA: Diagnosis not present

## 2015-05-18 DIAGNOSIS — I10 Essential (primary) hypertension: Secondary | ICD-10-CM | POA: Diagnosis not present

## 2015-05-18 DIAGNOSIS — Y998 Other external cause status: Secondary | ICD-10-CM | POA: Insufficient documentation

## 2015-05-18 DIAGNOSIS — Z8579 Personal history of other malignant neoplasms of lymphoid, hematopoietic and related tissues: Secondary | ICD-10-CM | POA: Insufficient documentation

## 2015-05-18 DIAGNOSIS — H269 Unspecified cataract: Secondary | ICD-10-CM | POA: Insufficient documentation

## 2015-05-18 DIAGNOSIS — Y9389 Activity, other specified: Secondary | ICD-10-CM | POA: Insufficient documentation

## 2015-05-18 DIAGNOSIS — M25562 Pain in left knee: Secondary | ICD-10-CM | POA: Diagnosis not present

## 2015-05-18 DIAGNOSIS — S60512A Abrasion of left hand, initial encounter: Secondary | ICD-10-CM | POA: Insufficient documentation

## 2015-05-18 DIAGNOSIS — S0990XA Unspecified injury of head, initial encounter: Secondary | ICD-10-CM | POA: Diagnosis not present

## 2015-05-18 DIAGNOSIS — Z8673 Personal history of transient ischemic attack (TIA), and cerebral infarction without residual deficits: Secondary | ICD-10-CM | POA: Insufficient documentation

## 2015-05-18 DIAGNOSIS — Z79899 Other long term (current) drug therapy: Secondary | ICD-10-CM | POA: Diagnosis not present

## 2015-05-18 DIAGNOSIS — Y92410 Unspecified street and highway as the place of occurrence of the external cause: Secondary | ICD-10-CM | POA: Diagnosis not present

## 2015-05-18 DIAGNOSIS — Z7951 Long term (current) use of inhaled steroids: Secondary | ICD-10-CM | POA: Diagnosis not present

## 2015-05-18 DIAGNOSIS — Z8546 Personal history of malignant neoplasm of prostate: Secondary | ICD-10-CM | POA: Diagnosis not present

## 2015-05-18 DIAGNOSIS — E785 Hyperlipidemia, unspecified: Secondary | ICD-10-CM | POA: Diagnosis not present

## 2015-05-18 DIAGNOSIS — F909 Attention-deficit hyperactivity disorder, unspecified type: Secondary | ICD-10-CM | POA: Diagnosis not present

## 2015-05-18 DIAGNOSIS — Z85828 Personal history of other malignant neoplasm of skin: Secondary | ICD-10-CM | POA: Diagnosis not present

## 2015-05-18 DIAGNOSIS — R0789 Other chest pain: Secondary | ICD-10-CM | POA: Diagnosis not present

## 2015-05-18 DIAGNOSIS — S8002XA Contusion of left knee, initial encounter: Secondary | ICD-10-CM | POA: Diagnosis not present

## 2015-05-18 DIAGNOSIS — S61409A Unspecified open wound of unspecified hand, initial encounter: Secondary | ICD-10-CM | POA: Diagnosis not present

## 2015-05-18 DIAGNOSIS — S299XXA Unspecified injury of thorax, initial encounter: Secondary | ICD-10-CM | POA: Insufficient documentation

## 2015-05-18 DIAGNOSIS — S8992XA Unspecified injury of left lower leg, initial encounter: Secondary | ICD-10-CM | POA: Diagnosis present

## 2015-05-18 MED ORDER — HYDROCODONE-ACETAMINOPHEN 5-325 MG PO TABS
1.0000 | ORAL_TABLET | Freq: Once | ORAL | Status: AC
  Administered 2015-05-18: 1 via ORAL
  Filled 2015-05-18: qty 1

## 2015-05-18 MED ORDER — IBUPROFEN 800 MG PO TABS: 800.0000 mg | ORAL_TABLET | Freq: Three times a day (TID) | ORAL | Status: DC

## 2015-05-18 MED ORDER — IBUPROFEN 800 MG PO TABS
800.0000 mg | ORAL_TABLET | Freq: Once | ORAL | Status: AC
  Administered 2015-05-18: 800 mg via ORAL
  Filled 2015-05-18: qty 1

## 2015-05-18 MED ORDER — HYDROCODONE-ACETAMINOPHEN 5-325 MG PO TABS: 1.0000 | ORAL_TABLET | Freq: Four times a day (QID) | ORAL | Status: DC | PRN
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
## 2015-05-18 NOTE — Discharge Instructions (Signed)

## 2015-05-25 ENCOUNTER — Encounter: Payer: Self-pay | Admitting: Internal Medicine

## 2015-05-26 ENCOUNTER — Ambulatory Visit (INDEPENDENT_AMBULATORY_CARE_PROVIDER_SITE_OTHER): Payer: Medicare Other | Admitting: Internal Medicine

## 2015-05-26 VITALS — BP 142/68 | HR 71 | Temp 98.4°F | Resp 17 | Ht 69.0 in | Wt 231.2 lb

## 2015-05-26 DIAGNOSIS — R079 Chest pain, unspecified: Secondary | ICD-10-CM

## 2015-05-26 DIAGNOSIS — R0789 Other chest pain: Secondary | ICD-10-CM

## 2015-05-26 DIAGNOSIS — M25562 Pain in left knee: Secondary | ICD-10-CM | POA: Diagnosis not present

## 2015-05-26 MED ORDER — MELOXICAM 15 MG PO TABS: 15.0000 mg | ORAL_TABLET | Freq: Every day | ORAL | Status: DC

## 2015-05-26 MED ORDER — HYDROCODONE-ACETAMINOPHEN 5-325 MG PO TABS: 1.0000 | ORAL_TABLET | Freq: Four times a day (QID) | ORAL | Status: DC | PRN
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
## 2015-06-08 DIAGNOSIS — I444 Left anterior fascicular block: Secondary | ICD-10-CM | POA: Diagnosis not present

## 2015-06-08 DIAGNOSIS — S83231D Complex tear of medial meniscus, current injury, right knee, subsequent encounter: Secondary | ICD-10-CM | POA: Diagnosis not present

## 2015-06-08 DIAGNOSIS — Z7982 Long term (current) use of aspirin: Secondary | ICD-10-CM | POA: Diagnosis not present

## 2015-06-08 DIAGNOSIS — I10 Essential (primary) hypertension: Secondary | ICD-10-CM | POA: Diagnosis not present

## 2015-06-08 DIAGNOSIS — M1711 Unilateral primary osteoarthritis, right knee: Secondary | ICD-10-CM | POA: Diagnosis not present

## 2015-06-18 ENCOUNTER — Telehealth: Payer: Self-pay | Admitting: Internal Medicine

## 2015-06-18 MED ORDER — MELOXICAM 15 MG PO TABS: 15.0000 mg | ORAL_TABLET | Freq: Every day | ORAL | Status: DC

## 2015-06-18 MED ORDER — HYDROCODONE-ACETAMINOPHEN 5-325 MG PO TABS: 1.0000 | ORAL_TABLET | Freq: Four times a day (QID) | ORAL | Status: DC | PRN

## 2015-06-18 MED ORDER — AMPHETAMINE-DEXTROAMPHETAMINE 20 MG PO TABS: 20.0000 mg | ORAL_TABLET | Freq: Two times a day (BID) | ORAL | Status: DC

## 2015-06-18 NOTE — Addendum Note (Signed)
Addended by: Leandrew Koyanagi on: 06/18/2015 03:47 PM   Modules accepted: Orders

## 2015-06-19 ENCOUNTER — Ambulatory Visit (INDEPENDENT_AMBULATORY_CARE_PROVIDER_SITE_OTHER): Payer: Medicare Other | Admitting: Internal Medicine

## 2015-06-19 VITALS — BP 145/87 | HR 69 | Temp 98.1°F | Resp 16 | Ht 69.0 in | Wt 231.0 lb

## 2015-06-19 DIAGNOSIS — M25562 Pain in left knee: Secondary | ICD-10-CM

## 2015-06-19 DIAGNOSIS — M25462 Effusion, left knee: Secondary | ICD-10-CM

## 2015-06-19 DIAGNOSIS — M2392 Unspecified internal derangement of left knee: Secondary | ICD-10-CM
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
## 2015-06-19 NOTE — Telephone Encounter (Signed)
Pt picked up Rx.

## 2015-06-24 ENCOUNTER — Other Ambulatory Visit: Payer: Self-pay | Admitting: Internal Medicine

## 2015-06-25 ENCOUNTER — Ambulatory Visit
Admission: RE | Admit: 2015-06-25 | Discharge: 2015-06-25 | Disposition: A | Discharge status: Home / Self Care | Payer: Medicare Other | Source: Ambulatory Visit | Attending: Internal Medicine | Admitting: Internal Medicine

## 2015-06-25 DIAGNOSIS — S83232A Complex tear of medial meniscus, current injury, left knee, initial encounter: Secondary | ICD-10-CM | POA: Diagnosis not present

## 2015-06-25 DIAGNOSIS — M25462 Effusion, left knee: Secondary | ICD-10-CM

## 2015-06-25 DIAGNOSIS — M2392 Unspecified internal derangement of left knee: Secondary | ICD-10-CM

## 2015-06-25 DIAGNOSIS — M25562 Pain in left knee: Secondary | ICD-10-CM

## 2015-07-06 ENCOUNTER — Ambulatory Visit (INDEPENDENT_AMBULATORY_CARE_PROVIDER_SITE_OTHER): Payer: Medicare Other | Admitting: Internal Medicine

## 2015-07-06 VITALS — BP 122/80 | HR 72 | Temp 98.4°F | Resp 20 | Ht 69.0 in | Wt 234.2 lb

## 2015-07-06 DIAGNOSIS — J069 Acute upper respiratory infection, unspecified: Secondary | ICD-10-CM

## 2015-07-06 DIAGNOSIS — R059 Cough, unspecified: Secondary | ICD-10-CM

## 2015-07-06 DIAGNOSIS — R05 Cough: Secondary | ICD-10-CM | POA: Diagnosis not present

## 2015-07-06 DIAGNOSIS — Z23 Encounter for immunization: Secondary | ICD-10-CM

## 2015-07-06 NOTE — Progress Notes (Signed)
Subjective:  By signing my name below, I, Raven Small, attest that this documentation has been prepared under the direction and in the presence of Baxter Kail, MD.  Electronically Signed: Thea Alken, ED Scribe. 07/06/2015. 4:23 PM.   Patient ID: Gary Parrish, male    DOB: [birth year], 75 y.o.   MRN: [id number]  HPI Chief Complaint  Patient presents with  . Immunizations    Pheumonia and flu shot   HPI Comments: Gary Parrish is a 75 y.o. male who presents to the [facility] for immunizations.pt is requesting pneumonia and flu shot.   Immunization History  Administered Date(s) Administered  . Influenza Split 04/11/2012  . Influenza,inj,Quad PF,36+ Mos 04/24/2013, 05/08/2014, 04/03/2015  . Pneumococcal Conjugate-13 11/20/2013, 05/08/2014  . Tdap 11/20/2013  . Zoster 05/27/2014     Pt also has had a cough for 1 week. Cough does not wake him at night. States when he wake up in the morning he produce clear sputum with cough. He reports associated post nasal drip and rhinorrhea with clear drainage. He denies fever or SOB.   His Rknee surgery was postponed due to this cough   Past Surgical History  Procedure Laterality Date  . Hemorrhoidal banding    . Eye surgery    . Prostate surgery     Prior to Admission medications   Medication Sig Start Date End Date Taking? Authorizing Provider  acetaminophen (TYLENOL) 650 MG CR tablet Take 650 mg by mouth every 8 (eight) hours as needed for pain.   Yes Historical Provider, MD  amLODipine (NORVASC) 5 MG tablet Take 1 tablet (5 mg total) by mouth daily. 12/31/14  Yes Leandrew Koyanagi, MD  amphetamine-dextroamphetamine (ADDERALL) 20 MG tablet Take 1 tablet (20 mg total) by mouth 2 (two) times daily. 06/18/15  Yes Leandrew Koyanagi, MD  amphetamine-dextroamphetamine (ADDERALL) 20 MG tablet Take 1 tablet (20 mg total) by mouth 2 (two) times daily. For 30d after signed 06/18/15  Yes Leandrew Koyanagi, MD    amphetamine-dextroamphetamine (ADDERALL) 20 MG tablet Take 1 tablet (20 mg total) by mouth 2 (two) times daily. For 60d after signed 06/18/15  Yes Leandrew Koyanagi, MD  aspirin 325 MG tablet Take 325 mg by mouth daily.   Yes Historical Provider, MD  atorvastatin (LIPITOR) 40 MG tablet Take 1 tablet (40 mg total) by mouth daily. 12/31/14  Yes Leandrew Koyanagi, MD  benazepril (LOTENSIN) 40 MG tablet TAKE ONE TABLET BY MOUTH DAILY. 12/31/14  Yes Leandrew Koyanagi, MD  FIBER PO Take 1 tablet by mouth daily.     Yes Historical Provider, MD  fluticasone (FLONASE) 50 MCG/ACT nasal spray 2 sprays each nostril at bedtime 12/31/14  Yes Leandrew Koyanagi, MD  hydrochlorothiazide (MICROZIDE) 12.5 MG capsule TAKE ONE CAPSULE BY MOUTH DAILY 06/26/15  Yes Leandrew Koyanagi, MD  HYDROcodone-acetaminophen (NORCO/VICODIN) 5-325 MG tablet Take 1 tablet by mouth every 6 (six) hours as needed. 06/18/15  Yes Leandrew Koyanagi, MD  ibuprofen (ADVIL,MOTRIN) 800 MG tablet Take 1 tablet (800 mg total) by mouth 3 (three) times daily. 05/18/15  Yes Gloriann Loan, PA-C  meloxicam (MOBIC) 15 MG tablet Take 1 tablet (15 mg total) by mouth daily. 06/18/15  Yes Leandrew Koyanagi, MD  Multiple Vitamins-Minerals (VISION VITAMINS PO) Take 1 tablet by mouth daily.     Yes Historical Provider, MD  omeprazole (PRILOSEC) 10 MG capsule Take 10 mg by mouth daily.   Yes Historical Provider, MD  sildenafil (REVATIO) 20 MG tablet TAKE 2-5 TABLETS BY MOUTH ONCE DAILY AS NEEDED FOR DESIRED EFFECT 01/09/15  Yes Leandrew Koyanagi, MD  sildenafil (REVATIO) 20 MG tablet TAKE 2-5 TABLETS BY MOUTH ONCE DAILY AS NEEDED FOR DESIRED EFFECT 01/14/15  Yes Leandrew Koyanagi, MD  traZODone (DESYREL) 100 MG tablet Take 1 tablet (100 mg total) by mouth daily. 12/31/14  Yes Leandrew Koyanagi, MD   Review of Systems  Constitutional: Negative for fever and chills.  HENT: Positive for postnasal drip and rhinorrhea. Negative for sore throat.   Respiratory:  Positive for cough. Negative for shortness of breath.    Objective:   Physical Exam  Constitutional: He is oriented to person, place, and time. He appears well-developed and well-nourished. No distress.  HENT:  Head: Normocephalic and atraumatic.  Mouth/Throat: Oropharynx is clear and moist.  Eyes: Conjunctivae and EOM are normal. Pupils are equal, round, and reactive to light.  Neck: Neck supple.  Cardiovascular: Normal rate, regular rhythm, normal heart sounds and intact distal pulses.   No murmur heard. Pulmonary/Chest: Effort normal and breath sounds normal. He has no wheezes. He has no rales.  Musculoskeletal: Normal range of motion.  Lymphadenopathy:    He has no cervical adenopathy.  Neurological: He is alert and oriented to person, place, and time.  Skin: Skin is warm and dry.  Psychiatric: He has a normal mood and affect. His behavior is normal.  Nursing note and vitals reviewed.  Filed Vitals:   07/06/15 1614  BP: 122/80  Pulse: 72  Temp: 98.4 F (36.9 C)  TempSrc: Oral  Resp: 20  Height: 5\' 9"  (1.753 m)  Weight: 234 lb 3.2 oz (106.232 kg)  SpO2: 93%   Assessment & Plan:  Cough Viral URI ---otc meds plenty  Immunization due---P23 --had prevnar 2016 and flu shot 03/2015   I have completed the patient encounter in its entirety as documented by the scribe, with editing by me where necessary. Robert P. Laney Pastor, M.D.

## 2015-07-08 DIAGNOSIS — M1711 Unilateral primary osteoarthritis, right knee: Secondary | ICD-10-CM | POA: Diagnosis not present

## 2015-07-08 DIAGNOSIS — M2241 Chondromalacia patellae, right knee: Secondary | ICD-10-CM | POA: Diagnosis not present

## 2015-07-08 DIAGNOSIS — S83231D Complex tear of medial meniscus, current injury, right knee, subsequent encounter: Secondary | ICD-10-CM | POA: Diagnosis not present

## 2015-07-20 DIAGNOSIS — E119 Type 2 diabetes mellitus without complications: Secondary | ICD-10-CM | POA: Diagnosis not present

## 2015-07-20 DIAGNOSIS — S83231A Complex tear of medial meniscus, current injury, right knee, initial encounter: Secondary | ICD-10-CM | POA: Diagnosis not present

## 2015-07-20 DIAGNOSIS — E784 Other hyperlipidemia: Secondary | ICD-10-CM | POA: Diagnosis not present

## 2015-07-20 DIAGNOSIS — Z85828 Personal history of other malignant neoplasm of skin: Secondary | ICD-10-CM | POA: Diagnosis not present

## 2015-07-20 DIAGNOSIS — M1711 Unilateral primary osteoarthritis, right knee: Secondary | ICD-10-CM | POA: Diagnosis not present

## 2015-07-20 DIAGNOSIS — E669 Obesity, unspecified: Secondary | ICD-10-CM | POA: Diagnosis not present

## 2015-07-20 DIAGNOSIS — M21161 Varus deformity, not elsewhere classified, right knee: Secondary | ICD-10-CM | POA: Diagnosis not present

## 2015-07-20 DIAGNOSIS — M659 Synovitis and tenosynovitis, unspecified: Secondary | ICD-10-CM | POA: Diagnosis not present

## 2015-07-20 DIAGNOSIS — N529 Male erectile dysfunction, unspecified: Secondary | ICD-10-CM | POA: Diagnosis not present

## 2015-07-20 DIAGNOSIS — S83241A Other tear of medial meniscus, current injury, right knee, initial encounter: Secondary | ICD-10-CM | POA: Diagnosis not present

## 2015-07-20 DIAGNOSIS — Z8673 Personal history of transient ischemic attack (TIA), and cerebral infarction without residual deficits: Secondary | ICD-10-CM | POA: Diagnosis not present

## 2015-07-20 DIAGNOSIS — I10 Essential (primary) hypertension: Secondary | ICD-10-CM | POA: Diagnosis not present

## 2015-07-20 DIAGNOSIS — K219 Gastro-esophageal reflux disease without esophagitis: Secondary | ICD-10-CM | POA: Diagnosis not present

## 2015-07-20 DIAGNOSIS — Z7951 Long term (current) use of inhaled steroids: Secondary | ICD-10-CM | POA: Diagnosis not present

## 2015-07-20 DIAGNOSIS — Z803 Family history of malignant neoplasm of breast: Secondary | ICD-10-CM | POA: Diagnosis not present

## 2015-07-20 DIAGNOSIS — Z79899 Other long term (current) drug therapy: Secondary | ICD-10-CM | POA: Diagnosis not present

## 2015-07-20 DIAGNOSIS — G4700 Insomnia, unspecified: Secondary | ICD-10-CM | POA: Diagnosis not present

## 2015-07-20 DIAGNOSIS — Z86010 Personal history of colonic polyps: Secondary | ICD-10-CM | POA: Diagnosis not present

## 2015-07-20 DIAGNOSIS — Z791 Long term (current) use of non-steroidal anti-inflammatories (NSAID): Secondary | ICD-10-CM | POA: Diagnosis not present

## 2015-07-20 DIAGNOSIS — M2241 Chondromalacia patellae, right knee: Secondary | ICD-10-CM | POA: Diagnosis not present

## 2015-07-20 DIAGNOSIS — Z7982 Long term (current) use of aspirin: Secondary | ICD-10-CM | POA: Diagnosis not present

## 2015-07-20 DIAGNOSIS — Z8546 Personal history of malignant neoplasm of prostate: Secondary | ICD-10-CM | POA: Diagnosis not present

## 2015-07-22 DIAGNOSIS — R0789 Other chest pain: Secondary | ICD-10-CM | POA: Diagnosis not present

## 2015-07-22 DIAGNOSIS — R072 Precordial pain: Secondary | ICD-10-CM | POA: Diagnosis not present

## 2015-07-22 DIAGNOSIS — K219 Gastro-esophageal reflux disease without esophagitis: Secondary | ICD-10-CM | POA: Diagnosis not present

## 2015-07-22 DIAGNOSIS — F909 Attention-deficit hyperactivity disorder, unspecified type: Secondary | ICD-10-CM | POA: Diagnosis not present

## 2015-07-22 DIAGNOSIS — I10 Essential (primary) hypertension: Secondary | ICD-10-CM | POA: Diagnosis not present

## 2015-07-22 DIAGNOSIS — R938 Abnormal findings on diagnostic imaging of other specified body structures: Secondary | ICD-10-CM | POA: Diagnosis not present

## 2015-07-22 DIAGNOSIS — E785 Hyperlipidemia, unspecified: Secondary | ICD-10-CM | POA: Diagnosis not present

## 2015-07-22 DIAGNOSIS — R109 Unspecified abdominal pain: Secondary | ICD-10-CM | POA: Diagnosis not present

## 2015-07-22 DIAGNOSIS — Z8673 Personal history of transient ischemic attack (TIA), and cerebral infarction without residual deficits: Secondary | ICD-10-CM | POA: Diagnosis not present

## 2015-07-22 DIAGNOSIS — R079 Chest pain, unspecified: Secondary | ICD-10-CM | POA: Diagnosis not present

## 2015-07-22 DIAGNOSIS — R1310 Dysphagia, unspecified: Secondary | ICD-10-CM | POA: Diagnosis not present

## 2015-07-22 DIAGNOSIS — R918 Other nonspecific abnormal finding of lung field: Secondary | ICD-10-CM | POA: Diagnosis not present

## 2015-07-22 DIAGNOSIS — Z8546 Personal history of malignant neoplasm of prostate: Secondary | ICD-10-CM | POA: Diagnosis not present

## 2015-07-22 DIAGNOSIS — G8929 Other chronic pain: Secondary | ICD-10-CM | POA: Diagnosis not present

## 2015-07-22 DIAGNOSIS — F329 Major depressive disorder, single episode, unspecified: Secondary | ICD-10-CM | POA: Diagnosis not present

## 2015-07-23 DIAGNOSIS — R590 Localized enlarged lymph nodes: Secondary | ICD-10-CM | POA: Diagnosis not present

## 2015-07-23 DIAGNOSIS — J9859 Other diseases of mediastinum, not elsewhere classified: Secondary | ICD-10-CM | POA: Diagnosis not present

## 2015-07-23 DIAGNOSIS — I10 Essential (primary) hypertension: Secondary | ICD-10-CM | POA: Diagnosis not present

## 2015-07-23 DIAGNOSIS — R938 Abnormal findings on diagnostic imaging of other specified body structures: Secondary | ICD-10-CM | POA: Diagnosis not present

## 2015-07-23 DIAGNOSIS — R1310 Dysphagia, unspecified: Secondary | ICD-10-CM | POA: Diagnosis not present

## 2015-07-23 DIAGNOSIS — R933 Abnormal findings on diagnostic imaging of other parts of digestive tract: Secondary | ICD-10-CM | POA: Diagnosis not present

## 2015-07-23 DIAGNOSIS — R079 Chest pain, unspecified: Secondary | ICD-10-CM | POA: Diagnosis not present

## 2015-07-27 DIAGNOSIS — R590 Localized enlarged lymph nodes: Secondary | ICD-10-CM | POA: Diagnosis not present

## 2015-07-28 ENCOUNTER — Ambulatory Visit (INDEPENDENT_AMBULATORY_CARE_PROVIDER_SITE_OTHER): Payer: Medicare Other | Admitting: Internal Medicine

## 2015-07-28 VITALS — BP 132/70 | HR 81 | Temp 97.9°F | Resp 16 | Ht 69.0 in | Wt 227.4 lb

## 2015-07-28 DIAGNOSIS — J0100 Acute maxillary sinusitis, unspecified: Secondary | ICD-10-CM | POA: Diagnosis not present

## 2015-07-28 MED ORDER — HYDROCODONE-HOMATROPINE 5-1.5 MG/5ML PO SYRP: 5.0000 mL | ORAL_SOLUTION | Freq: Four times a day (QID) | ORAL | Status: DC | PRN

## 2015-07-28 MED ORDER — AMPHETAMINE-DEXTROAMPHETAMINE 20 MG PO TABS: 20.0000 mg | ORAL_TABLET | Freq: Two times a day (BID) | ORAL | Status: DC

## 2015-07-28 MED ORDER — AMOXICILLIN 875 MG PO TABS: 875.0000 mg | ORAL_TABLET | Freq: Two times a day (BID) | ORAL | Status: DC

## 2015-07-28 MED ORDER — PREDNISONE 20 MG PO TABS: ORAL_TABLET | ORAL | Status: DC
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
## 2015-08-03 DIAGNOSIS — Z4789 Encounter for other orthopedic aftercare: Secondary | ICD-10-CM | POA: Diagnosis not present

## 2015-08-03 DIAGNOSIS — Z4802 Encounter for removal of sutures: Secondary | ICD-10-CM | POA: Diagnosis not present

## 2015-08-05 DIAGNOSIS — H04123 Dry eye syndrome of bilateral lacrimal glands: Secondary | ICD-10-CM | POA: Diagnosis not present

## 2015-08-05 DIAGNOSIS — H43393 Other vitreous opacities, bilateral: Secondary | ICD-10-CM | POA: Diagnosis not present

## 2015-08-06 DIAGNOSIS — G8929 Other chronic pain: Secondary | ICD-10-CM | POA: Diagnosis not present

## 2015-08-06 DIAGNOSIS — M6281 Muscle weakness (generalized): Secondary | ICD-10-CM | POA: Diagnosis not present

## 2015-08-06 DIAGNOSIS — Z7409 Other reduced mobility: Secondary | ICD-10-CM | POA: Diagnosis not present

## 2015-08-06 DIAGNOSIS — Z9889 Other specified postprocedural states: Secondary | ICD-10-CM | POA: Diagnosis not present

## 2015-08-06 DIAGNOSIS — M25561 Pain in right knee: Secondary | ICD-10-CM | POA: Diagnosis not present

## 2015-08-06 DIAGNOSIS — M1711 Unilateral primary osteoarthritis, right knee: Secondary | ICD-10-CM | POA: Diagnosis not present

## 2015-08-10 ENCOUNTER — Encounter: Payer: Self-pay | Admitting: Internal Medicine

## 2015-08-13 DIAGNOSIS — Z7409 Other reduced mobility: Secondary | ICD-10-CM | POA: Diagnosis not present

## 2015-08-13 DIAGNOSIS — M6281 Muscle weakness (generalized): Secondary | ICD-10-CM | POA: Diagnosis not present

## 2015-08-13 DIAGNOSIS — M1711 Unilateral primary osteoarthritis, right knee: Secondary | ICD-10-CM | POA: Diagnosis not present

## 2015-08-13 DIAGNOSIS — M25561 Pain in right knee: Secondary | ICD-10-CM | POA: Diagnosis not present

## 2015-08-13 DIAGNOSIS — G8929 Other chronic pain: Secondary | ICD-10-CM | POA: Diagnosis not present

## 2015-08-13 DIAGNOSIS — Z9889 Other specified postprocedural states: Secondary | ICD-10-CM | POA: Diagnosis not present

## 2015-08-17 NOTE — Telephone Encounter (Signed)
Sign or give me to sign and give to him

## 2015-08-18 DIAGNOSIS — G8929 Other chronic pain: Secondary | ICD-10-CM | POA: Diagnosis not present

## 2015-08-18 DIAGNOSIS — Z7409 Other reduced mobility: Secondary | ICD-10-CM | POA: Diagnosis not present

## 2015-08-18 DIAGNOSIS — Z9889 Other specified postprocedural states: Secondary | ICD-10-CM | POA: Diagnosis not present

## 2015-08-18 DIAGNOSIS — M6281 Muscle weakness (generalized): Secondary | ICD-10-CM | POA: Diagnosis not present

## 2015-08-18 DIAGNOSIS — M25561 Pain in right knee: Secondary | ICD-10-CM | POA: Diagnosis not present

## 2015-08-18 DIAGNOSIS — M1711 Unilateral primary osteoarthritis, right knee: Secondary | ICD-10-CM | POA: Diagnosis not present

## 2015-08-20 DIAGNOSIS — Z7409 Other reduced mobility: Secondary | ICD-10-CM | POA: Diagnosis not present

## 2015-08-20 DIAGNOSIS — M1711 Unilateral primary osteoarthritis, right knee: Secondary | ICD-10-CM | POA: Diagnosis not present

## 2015-08-20 DIAGNOSIS — M25561 Pain in right knee: Secondary | ICD-10-CM | POA: Diagnosis not present

## 2015-08-20 DIAGNOSIS — G8929 Other chronic pain: Secondary | ICD-10-CM | POA: Diagnosis not present

## 2015-08-20 DIAGNOSIS — M6281 Muscle weakness (generalized): Secondary | ICD-10-CM | POA: Diagnosis not present

## 2015-08-20 DIAGNOSIS — Z9889 Other specified postprocedural states: Secondary | ICD-10-CM | POA: Diagnosis not present

## 2015-08-23 ENCOUNTER — Ambulatory Visit (INDEPENDENT_AMBULATORY_CARE_PROVIDER_SITE_OTHER): Payer: Medicare Other | Admitting: Internal Medicine

## 2015-08-23 VITALS — BP 140/72 | HR 76 | Temp 97.6°F | Resp 14 | Ht 69.0 in | Wt 228.0 lb

## 2015-08-23 DIAGNOSIS — R0981 Nasal congestion: Secondary | ICD-10-CM

## 2015-08-23 DIAGNOSIS — M25561 Pain in right knee: Secondary | ICD-10-CM

## 2015-08-23 DIAGNOSIS — R05 Cough: Secondary | ICD-10-CM | POA: Diagnosis not present

## 2015-08-23 DIAGNOSIS — M25512 Pain in left shoulder: Secondary | ICD-10-CM

## 2015-08-23 DIAGNOSIS — R059 Cough, unspecified: Secondary | ICD-10-CM

## 2015-08-23 MED ORDER — HYDROCODONE-ACETAMINOPHEN 10-325 MG PO TABS: 1.0000 | ORAL_TABLET | Freq: Four times a day (QID) | ORAL | Status: DC | PRN

## 2015-08-23 MED ORDER — PREDNISONE 20 MG PO TABS: ORAL_TABLET | ORAL | Status: DC

## 2015-08-23 NOTE — Progress Notes (Signed)
Subjective:  By signing my name below, I, Gary Parrish, attest that this documentation has been prepared under the direction and in the presence of Gary Lin, MD. Electronically Signed: Moises Parrish, [place]. 08/23/2015 , 9:25 AM .  Patient was seen in Room 13 .   Patient ID: Gary Parrish, male    DOB: December 28, 1940, 75 y.o.   MRN: [id number] Chief Complaint  Patient presents with  . Shoulder Pain    right shoulder pain 3 weeks   HPI  Shoulder Pain Gary Parrish is a 75 y.o. male who presents to [facility] complaining of bilateral shoulder pain (L greater than R) that's been noticed more about 3 weeks now. This issue has been ongoing for about a year. It would flare up intermittently, and resolve itself eventually. He hasn't had any surgery done in the area. He is planning to travel to [place] soon (09/03/15) and is concerned about carrying bags through customs.   R knee pain He's going to therapy for his right knee next week. He mostly has good days walking on his knees, but if he's on his feet all day, it starts to ache. He informs that just taking tylenol gives temporary relief. However, he's concerned that it'll have more pain when he travels to [place].   Cough  congestion He also mentions having some coughs at night. He feels that his nasal passages are congested and would mostly breathe through his mouth. He denies any fever, or shortness of breath. He was seen here a month ago for acute maxillary sinusitis; was given hycodan cough syrup, amoxicillin, and prednisone.   Patient Active Problem List   Diagnosis Date Noted  . CVA (cerebral vascular accident) ([place]) 12/05/2012  . ED (erectile dysfunction) 12/05/2012  . Attention deficit disorder 03/10/2011  . HTN (hypertension) 03/10/2011  . FATIGUE 01/01/2010  . CHEST DISCOMFORT 01/01/2010  . HIP PAIN, RIGHT 02/11/2009  . KNEE PAIN, RIGHT 02/11/2009  . GERD 09/16/2008  . DIVERTICULOSIS, COLON 09/16/2008  . PROSTATE CANCER, HX OF  09/16/2008  . SKIN CANCER, HX OF 09/16/2008  . HYPERLIPIDEMIA 05/31/2007  . METABOLIC SYNDROME X [id number]  . 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 UNS ADVRS EFF UNS RX MEDICINAL&BIOLOGICAL SBSTNC 05/31/2007  . COLONIC POLYPS, HX OF 07/21/2006    Current outpatient prescriptions:  .  acetaminophen (TYLENOL) 650 MG CR tablet, Take 650 mg by mouth every 8 (eight) hours as needed for pain., Disp: , Rfl:  .  amLODipine (NORVASC) 5 MG tablet, Take 1 tablet (5 mg total) by mouth daily., Disp: 90 tablet, Rfl: 3 .  amoxicillin (AMOXIL) 875 MG tablet, Take 1 tablet (875 mg total) by mouth 2 (two) times daily., Disp: 20 tablet, Rfl: 0 .  amphetamine-dextroamphetamine (ADDERALL) 20 MG tablet, Take 1 tablet (20 mg total) by mouth 2 (two) times daily. For 30d after signed, Disp: 60 tablet, Rfl: 0 .  amphetamine-dextroamphetamine (ADDERALL) 20 MG tablet, Take 1 tablet (20 mg total) by mouth 2 (two) times daily. For 60d after signed, Disp: 60 tablet, Rfl: 0 .  amphetamine-dextroamphetamine (ADDERALL) 20 MG tablet, Take 1 tablet (20 mg total) by mouth 2 (two) times daily. This would normally be filled 09/14/2015 but he will travel out of the country for most of the month of March and should be allowed to fill this before he leaves, Disp: 60 tablet, Rfl: 0 .  aspirin 325 MG tablet, Take 325 mg by mouth daily., Disp: , Rfl:  .  atorvastatin (LIPITOR) 40 MG tablet, Take 1 tablet (40 mg total) by mouth daily., Disp: 90 tablet, Rfl: 3 .  benazepril (LOTENSIN) 40 MG tablet, TAKE ONE TABLET BY MOUTH DAILY., Disp: 90 tablet, Rfl: 3 .  FIBER PO, Take 1 tablet by mouth daily.  , Disp: , Rfl:  .  fluticasone (FLONASE) 50 MCG/ACT nasal spray, 2 sprays each nostril at bedtime, Disp: 16 g, Rfl: 6 .  hydrochlorothiazide (MICROZIDE) 12.5 MG capsule, TAKE ONE CAPSULE BY MOUTH DAILY, Disp: 90 capsule, Rfl: 3 .  HYDROcodone-acetaminophen (NORCO/VICODIN) 5-325 MG tablet, Take 1 tablet by mouth every 6  (six) hours as needed., Disp: 25 tablet, Rfl: 0 .  HYDROcodone-homatropine (HYCODAN) 5-1.5 MG/5ML syrup, Take 5 mLs by mouth every 6 (six) hours as needed., Disp: 120 mL, Rfl: 0 .  ibuprofen (ADVIL,MOTRIN) 800 MG tablet, Take 1 tablet (800 mg total) by mouth 3 (three) times daily., Disp: 21 tablet, Rfl: 0 .  meloxicam (MOBIC) 15 MG tablet, Take 1 tablet (15 mg total) by mouth daily., Disp: 30 tablet, Rfl: 0 .  Multiple Vitamins-Minerals (VISION VITAMINS PO), Take 1 tablet by mouth daily.  , Disp: , Rfl:  .  omeprazole (PRILOSEC) 10 MG capsule, Take 10 mg by mouth daily., Disp: , Rfl:  .  predniSONE (DELTASONE) 20 MG tablet, 3/3/2/2/1/1 single daily dose for 6 days, Disp: 12 tablet, Rfl: 0 .  sildenafil (REVATIO) 20 MG tablet, TAKE 2-5 TABLETS BY MOUTH ONCE DAILY AS NEEDED FOR DESIRED EFFECT, Disp: 50 tablet, Rfl: 5 .  sildenafil (REVATIO) 20 MG tablet, TAKE 2-5 TABLETS BY MOUTH ONCE DAILY AS NEEDED FOR DESIRED EFFECT, Disp: 50 tablet, Rfl: 5 .  traZODone (DESYREL) 100 MG tablet, Take 1 tablet (100 mg total) by mouth daily., Disp: 90 tablet, Rfl: 3  Review of Systems  Constitutional: Negative for fever, appetite change and fatigue.  HENT: Positive for congestion. Negative for rhinorrhea and sinus pressure.   Respiratory: Positive for cough. Negative for shortness of breath.   Gastrointestinal: Negative for nausea, vomiting and diarrhea.  Musculoskeletal: Positive for myalgias and arthralgias. Negative for gait problem.      Objective:   Physical Exam  Constitutional: He is oriented to person, place, and time. He appears well-developed and well-nourished. No distress.  HENT:  Head: Normocephalic and atraumatic.  Mouth/Throat: Oropharynx is clear and moist.  Boggy turbinates in nose  Eyes: EOM are normal. Pupils are equal, round, and reactive to light.  Neck: Neck supple.  Cardiovascular: Normal rate.   Pulmonary/Chest: Effort normal and breath sounds normal. No respiratory distress. He has  no wheezes.  Musculoskeletal: Normal range of motion.  Left shoulder has painful arc 75-120 abduction with pain on resisted abduction and anterior elevation, good adduction and rotation; mildly tender over posterior deltoid without swelling  Neurological: He is alert and oriented to person, place, and time.  Skin: Skin is warm and dry.  Psychiatric: He has a normal mood and affect. His behavior is normal.  Nursing note and vitals reviewed.  BP 140/72 mmHg  Pulse 76  Temp(Src) 97.6 F (36.4 C) (Oral)  Resp 14  Ht 5\' 9"  (1.753 m)  Wt 228 lb (103.42 kg)  BMI 33.65 kg/m2  SpO2 97%     Assessment & Plan:  Cough Nasal congestion ---should resolve before trip--if not needs round of pred before leaves  Shoulder pain, left--bursitis --rom exer//sent note to PT for eval where in knee rehab([facility])  Knee pain, right--postop --HC refill at [facility] higher dose to  take on trip if needed//cont rehab I have completed the patient encounter in its entirety as documented by the scribe, with editing by me where necessary. Reiko Vinje P. Laney Pastor, M.D.

## 2015-08-25 DIAGNOSIS — M6281 Muscle weakness (generalized): Secondary | ICD-10-CM | POA: Diagnosis not present

## 2015-08-25 DIAGNOSIS — Z9889 Other specified postprocedural states: Secondary | ICD-10-CM | POA: Diagnosis not present

## 2015-08-25 DIAGNOSIS — M1711 Unilateral primary osteoarthritis, right knee: Secondary | ICD-10-CM | POA: Diagnosis not present

## 2015-08-25 DIAGNOSIS — M25561 Pain in right knee: Secondary | ICD-10-CM | POA: Diagnosis not present

## 2015-08-25 DIAGNOSIS — G8929 Other chronic pain: Secondary | ICD-10-CM | POA: Diagnosis not present

## 2015-08-25 DIAGNOSIS — Z7409 Other reduced mobility: Secondary | ICD-10-CM | POA: Diagnosis not present

## 2015-08-27 DIAGNOSIS — M6281 Muscle weakness (generalized): Secondary | ICD-10-CM | POA: Diagnosis not present

## 2015-08-27 DIAGNOSIS — M25512 Pain in left shoulder: Secondary | ICD-10-CM | POA: Diagnosis not present

## 2015-08-27 DIAGNOSIS — M25532 Pain in left wrist: Secondary | ICD-10-CM | POA: Diagnosis not present

## 2015-08-27 DIAGNOSIS — M25612 Stiffness of left shoulder, not elsewhere classified: Secondary | ICD-10-CM | POA: Diagnosis not present

## 2015-08-27 DIAGNOSIS — Z7409 Other reduced mobility: Secondary | ICD-10-CM | POA: Diagnosis not present

## 2015-08-27 DIAGNOSIS — M2241 Chondromalacia patellae, right knee: Secondary | ICD-10-CM | POA: Diagnosis not present

## 2015-08-27 DIAGNOSIS — G8929 Other chronic pain: Secondary | ICD-10-CM | POA: Diagnosis not present

## 2015-08-27 DIAGNOSIS — M25561 Pain in right knee: Secondary | ICD-10-CM | POA: Diagnosis not present

## 2015-08-27 DIAGNOSIS — S83231D Complex tear of medial meniscus, current injury, right knee, subsequent encounter: Secondary | ICD-10-CM | POA: Diagnosis not present

## 2015-08-27 DIAGNOSIS — M7552 Bursitis of left shoulder: Secondary | ICD-10-CM | POA: Diagnosis not present

## 2015-08-27 DIAGNOSIS — M1711 Unilateral primary osteoarthritis, right knee: Secondary | ICD-10-CM | POA: Diagnosis not present

## 2015-08-27 DIAGNOSIS — M7582 Other shoulder lesions, left shoulder: Secondary | ICD-10-CM | POA: Diagnosis not present

## 2015-08-27 DIAGNOSIS — M1732 Unilateral post-traumatic osteoarthritis, left knee: Secondary | ICD-10-CM | POA: Diagnosis not present

## 2015-08-27 DIAGNOSIS — Z9889 Other specified postprocedural states: Secondary | ICD-10-CM | POA: Diagnosis not present

## 2015-08-31 ENCOUNTER — Ambulatory Visit (INDEPENDENT_AMBULATORY_CARE_PROVIDER_SITE_OTHER): Payer: Medicare Other | Admitting: Internal Medicine

## 2015-08-31 VITALS — BP 174/96 | HR 71 | Temp 98.3°F | Resp 18 | Ht 70.0 in | Wt 233.0 lb

## 2015-08-31 DIAGNOSIS — I10 Essential (primary) hypertension: Secondary | ICD-10-CM | POA: Diagnosis not present

## 2015-08-31 DIAGNOSIS — M25512 Pain in left shoulder: Secondary | ICD-10-CM

## 2015-08-31 NOTE — Progress Notes (Signed)
   Subjective:  By signing my name below, I, Gary Parrish, attest that this documentation has been prepared under the direction and in the presence of Gary Lin, MD. Electronically Signed: Moises Parrish, [place]. 08/31/2015 , 4:34 PM .  Patient was seen in Room 7 .   Patient ID: Gary Parrish, male    DOB: 07-28-1940, 75 y.o.   MRN: [id number] Chief Complaint  Patient presents with  . pain in joints    Lt Shoulder x 1 month -NKI-   HPI Gary Parrish is a 75 y.o. male who presents to [facility] complaining of left shoulder pain that started a month ago. Note evaluation at his last visit. He plans on traveling to [place] soon (09/03/15) and he's still concerned about the long travel with the L shoulder pain. He has an appointment tomorrow at 8:15AM for therapy of his knees and shoulder, but he has not made much progress todate. During his previous visit, exercises for improvement was given and informed, and he was sent to PT. He's been doing the exercises however without much relief. He requests having a cortisone injection done today.      Review of Systems  Constitutional: Negative for fever, chills and fatigue.  Gastrointestinal: Negative for nausea, vomiting, abdominal pain and diarrhea.  Musculoskeletal: Positive for myalgias, arthralgias (L shoulder) and gait problem. Negative for back pain, joint swelling, neck pain and neck stiffness.  Skin: Negative for rash and wound.      Objective:   Physical Exam  Constitutional: He is oriented to person, place, and time. He appears well-developed and well-nourished. No distress.  HENT:  Head: Normocephalic and atraumatic.  Eyes: EOM are normal. Pupils are equal, round, and reactive to light.  Neck: Neck supple.  Cardiovascular: Normal rate.   Pulmonary/Chest: Effort normal. No respiratory distress.  Musculoskeletal: Normal range of motion.  Left shoulder: continues to demonstrate painful arc between 75 and 120 degrees, any rom passively  above 75 degrees also painful, resisted abduction is painful, but good adduction, point of maximum tenderness is the posterior deltoid  Neurological: He is alert and oriented to person, place, and time.  Skin: Skin is warm and dry.  Psychiatric: He has a normal mood and affect. His behavior is normal.  Nursing note and vitals reviewed.  BP 174/96 mmHg  Pulse 71  Temp(Src) 98.3 F (36.8 C) (Oral)  Resp 18  Ht 5\' 10"  (1.778 m)  Wt 233 lb (105.688 kg)  BMI 33.43 kg/m2  SpO2 98%   Procedure: with his consent, under sterile conditions, injected 1cc kenalog and 2cc 2% xylocaine using posterior approach - without any difficulties///he had better range of motion after the procedure    Assessment & Plan:   Shoulder pain on the left with loss of range of motion--approaching frozen shoulder  Injection completed Continue physical therapy after one week

## 2015-09-01 DIAGNOSIS — M6281 Muscle weakness (generalized): Secondary | ICD-10-CM | POA: Diagnosis not present

## 2015-09-01 DIAGNOSIS — Z9889 Other specified postprocedural states: Secondary | ICD-10-CM | POA: Diagnosis not present

## 2015-09-01 DIAGNOSIS — Z7409 Other reduced mobility: Secondary | ICD-10-CM | POA: Diagnosis not present

## 2015-09-01 DIAGNOSIS — M25512 Pain in left shoulder: Secondary | ICD-10-CM | POA: Diagnosis not present

## 2015-09-01 DIAGNOSIS — M25561 Pain in right knee: Secondary | ICD-10-CM | POA: Diagnosis not present

## 2015-09-01 DIAGNOSIS — M1711 Unilateral primary osteoarthritis, right knee: Secondary | ICD-10-CM | POA: Diagnosis not present

## 2015-09-18 DIAGNOSIS — Z9889 Other specified postprocedural states: Secondary | ICD-10-CM | POA: Diagnosis not present

## 2015-09-18 DIAGNOSIS — M1711 Unilateral primary osteoarthritis, right knee: Secondary | ICD-10-CM | POA: Diagnosis not present

## 2015-09-18 DIAGNOSIS — Z7409 Other reduced mobility: Secondary | ICD-10-CM | POA: Diagnosis not present

## 2015-09-18 DIAGNOSIS — M6281 Muscle weakness (generalized): Secondary | ICD-10-CM | POA: Diagnosis not present

## 2015-09-18 DIAGNOSIS — M25512 Pain in left shoulder: Secondary | ICD-10-CM | POA: Diagnosis not present

## 2015-09-18 DIAGNOSIS — M25561 Pain in right knee: Secondary | ICD-10-CM | POA: Diagnosis not present

## 2015-09-21 DIAGNOSIS — M1711 Unilateral primary osteoarthritis, right knee: Secondary | ICD-10-CM | POA: Diagnosis not present

## 2015-09-21 DIAGNOSIS — M6281 Muscle weakness (generalized): Secondary | ICD-10-CM | POA: Diagnosis not present

## 2015-09-21 DIAGNOSIS — Z9889 Other specified postprocedural states: Secondary | ICD-10-CM | POA: Diagnosis not present

## 2015-09-21 DIAGNOSIS — Z7409 Other reduced mobility: Secondary | ICD-10-CM | POA: Diagnosis not present

## 2015-09-21 DIAGNOSIS — M25561 Pain in right knee: Secondary | ICD-10-CM | POA: Diagnosis not present

## 2015-09-21 DIAGNOSIS — M25512 Pain in left shoulder: Secondary | ICD-10-CM | POA: Diagnosis not present

## 2015-09-22 ENCOUNTER — Other Ambulatory Visit: Payer: Self-pay | Admitting: Internal Medicine

## 2015-09-26 ENCOUNTER — Other Ambulatory Visit: Payer: Self-pay | Admitting: Internal Medicine

## 2015-09-26 ENCOUNTER — Telehealth: Payer: Self-pay

## 2015-09-26 NOTE — Telephone Encounter (Signed)
Pt is requesting a refill of amphetamine-dextroamphetamine (ADDERALL) 20 MG tablet [id number].

## 2015-09-28 ENCOUNTER — Ambulatory Visit: Payer: Medicare Other

## 2015-09-28 DIAGNOSIS — M1711 Unilateral primary osteoarthritis, right knee: Secondary | ICD-10-CM | POA: Diagnosis not present

## 2015-09-28 DIAGNOSIS — M25512 Pain in left shoulder: Secondary | ICD-10-CM | POA: Diagnosis not present

## 2015-09-28 DIAGNOSIS — S83222A Peripheral tear of medial meniscus, current injury, left knee, initial encounter: Secondary | ICD-10-CM | POA: Diagnosis not present

## 2015-09-28 DIAGNOSIS — M2241 Chondromalacia patellae, right knee: Secondary | ICD-10-CM | POA: Diagnosis not present

## 2015-09-28 DIAGNOSIS — M6281 Muscle weakness (generalized): Secondary | ICD-10-CM | POA: Diagnosis not present

## 2015-09-28 DIAGNOSIS — Z9889 Other specified postprocedural states: Secondary | ICD-10-CM | POA: Diagnosis not present

## 2015-09-28 DIAGNOSIS — S83231D Complex tear of medial meniscus, current injury, right knee, subsequent encounter: Secondary | ICD-10-CM | POA: Diagnosis not present

## 2015-09-28 DIAGNOSIS — M25612 Stiffness of left shoulder, not elsewhere classified: Secondary | ICD-10-CM | POA: Diagnosis not present

## 2015-09-28 DIAGNOSIS — M1732 Unilateral post-traumatic osteoarthritis, left knee: Secondary | ICD-10-CM | POA: Diagnosis not present

## 2015-09-28 DIAGNOSIS — R29898 Other symptoms and signs involving the musculoskeletal system: Secondary | ICD-10-CM | POA: Diagnosis not present

## 2015-09-28 DIAGNOSIS — M25561 Pain in right knee: Secondary | ICD-10-CM | POA: Diagnosis not present

## 2015-09-28 DIAGNOSIS — Z7409 Other reduced mobility: Secondary | ICD-10-CM | POA: Diagnosis not present

## 2015-09-28 DIAGNOSIS — G8929 Other chronic pain: Secondary | ICD-10-CM | POA: Diagnosis not present

## 2015-09-28 DIAGNOSIS — M7582 Other shoulder lesions, left shoulder: Secondary | ICD-10-CM | POA: Diagnosis not present

## 2015-09-28 MED ORDER — AMPHETAMINE-DEXTROAMPHETAMINE 20 MG PO TABS: 20.0000 mg | ORAL_TABLET | Freq: Two times a day (BID) | ORAL | Status: DC

## 2015-09-28 NOTE — Telephone Encounter (Signed)
Notified pt ready. 

## 2015-10-05 DIAGNOSIS — S83231D Complex tear of medial meniscus, current injury, right knee, subsequent encounter: Secondary | ICD-10-CM | POA: Diagnosis not present

## 2015-10-05 DIAGNOSIS — M2241 Chondromalacia patellae, right knee: Secondary | ICD-10-CM | POA: Diagnosis not present

## 2015-10-05 DIAGNOSIS — M25512 Pain in left shoulder: Secondary | ICD-10-CM | POA: Diagnosis not present

## 2015-10-05 DIAGNOSIS — Z9889 Other specified postprocedural states: Secondary | ICD-10-CM | POA: Diagnosis not present

## 2015-10-05 DIAGNOSIS — M25561 Pain in right knee: Secondary | ICD-10-CM | POA: Diagnosis not present

## 2015-10-05 DIAGNOSIS — G8929 Other chronic pain: Secondary | ICD-10-CM | POA: Diagnosis not present

## 2015-10-15 DIAGNOSIS — M2241 Chondromalacia patellae, right knee: Secondary | ICD-10-CM | POA: Diagnosis not present

## 2015-10-15 DIAGNOSIS — G8929 Other chronic pain: Secondary | ICD-10-CM | POA: Diagnosis not present

## 2015-10-15 DIAGNOSIS — M25512 Pain in left shoulder: Secondary | ICD-10-CM | POA: Diagnosis not present

## 2015-10-15 DIAGNOSIS — M25561 Pain in right knee: Secondary | ICD-10-CM | POA: Diagnosis not present

## 2015-10-15 DIAGNOSIS — Z9889 Other specified postprocedural states: Secondary | ICD-10-CM | POA: Diagnosis not present

## 2015-10-15 DIAGNOSIS — S83231D Complex tear of medial meniscus, current injury, right knee, subsequent encounter: Secondary | ICD-10-CM | POA: Diagnosis not present

## 2015-10-17 ENCOUNTER — Ambulatory Visit (INDEPENDENT_AMBULATORY_CARE_PROVIDER_SITE_OTHER): Payer: Medicare Other | Admitting: Internal Medicine

## 2015-10-17 VITALS — BP 138/96 | HR 77 | Temp 97.7°F | Resp 16 | Ht 70.0 in | Wt 227.0 lb

## 2015-10-17 DIAGNOSIS — M25561 Pain in right knee: Secondary | ICD-10-CM | POA: Diagnosis not present

## 2015-10-17 DIAGNOSIS — M25562 Pain in left knee: Secondary | ICD-10-CM | POA: Diagnosis not present

## 2015-10-17 DIAGNOSIS — M25512 Pain in left shoulder: Secondary | ICD-10-CM | POA: Diagnosis not present

## 2015-10-17 MED ORDER — FLUTICASONE PROPIONATE 50 MCG/ACT NA SUSP: NASAL | Status: AC

## 2015-10-17 MED ORDER — HYDROCODONE-ACETAMINOPHEN 10-325 MG PO TABS: ORAL_TABLET | ORAL | Status: DC

## 2015-10-17 MED ORDER — AMPHETAMINE-DEXTROAMPHETAMINE 30 MG PO TABS: 30.0000 mg | ORAL_TABLET | Freq: Two times a day (BID) | ORAL | Status: DC

## 2015-10-17 NOTE — Progress Notes (Signed)
Subjective:  By signing my name below, I, Raven Small, attest that this documentation has been prepared under the direction and in the presence of [name], MD.  Electronically Signed: Thea Alken, ED Scribe. 10/17/2015. 9:08 AM.   Patient ID: Gary Parrish, male    DOB: April 30, 1941, 75 y.o.   MRN: [id number]  HPI Chief Complaint  Patient presents with  . Shoulder Pain    Left shoulder pain   . Knee Pain    Bilateral   . Medication Problem    Would like to discuss changing adderall and need flonase refill    HPI Comments: Gary Parrish is a 75 y.o. male who presents to the [facility] complaining of worsening left shoulder pain and knee pain. Pt received an injection during his last visit about 6 weeks ago which gave him minimal relief. He has an appoint wiith Ortho on 5/10. Pt is leaving for [place] tomorrow for business and will be there for a week. Pt would like pain medication for trip. He has been on hydrocodone 5-325 in the past but felt this was not strong enough.   Pt would also like a refill of flonase.   Pt is on adderall 20mg  bid. He has been breaking tablets in half and taking half tablet every 2-3 hours about 4 times a day.   Patient Active Problem List   Diagnosis Date Noted  . CVA (cerebral vascular accident) ([place]) 12/05/2012  . ED (erectile dysfunction) 12/05/2012  . Attention deficit disorder 03/10/2011  . HTN (hypertension) 03/10/2011  . FATIGUE 01/01/2010  . CHEST DISCOMFORT 01/01/2010  . HIP PAIN, RIGHT 02/11/2009  . KNEE PAIN, RIGHT 02/11/2009  . GERD 09/16/2008  . DIVERTICULOSIS, COLON 09/16/2008  . PROSTATE CANCER, HX OF 09/16/2008  . SKIN CANCER, HX OF 09/16/2008  . HYPERLIPIDEMIA 05/31/2007  . METABOLIC SYNDROME X [id number]  . 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 UNS ADVRS EFF UNS RX MEDICINAL&BIOLOGICAL SBSTNC 05/31/2007  . COLONIC POLYPS, HX OF 07/21/2006    Past Surgical History  Procedure  Laterality Date  . Hemorrhoidal banding    . Eye surgery    . Prostate surgery     No Known Allergies Prior to Admission medications   Medication Sig Start Date End Date Taking? Authorizing Provider  acetaminophen (TYLENOL) 650 MG CR tablet Take 650 mg by mouth every 8 (eight) hours as needed for pain.   Yes Historical Provider, MD  amLODipine (NORVASC) 5 MG tablet Take 1 tablet (5 mg total) by mouth daily. 12/31/14  Yes Leandrew Koyanagi, MD  amphetamine-dextroamphetamine (ADDERALL) 20 MG tablet Take 1 tablet (20 mg total) by mouth 2 (two) times daily. For 30d after signed 09/28/15  Yes Leandrew Koyanagi, MD  aspirin 325 MG tablet Take 325 mg by mouth daily.   Yes Historical Provider, MD  atorvastatin (LIPITOR) 40 MG tablet Take 1 tablet (40 mg total) by mouth daily. 12/31/14  Yes Leandrew Koyanagi, MD  benazepril (LOTENSIN) 40 MG tablet TAKE ONE TABLET BY MOUTH DAILY. 12/31/14  Yes Leandrew Koyanagi, MD  FIBER PO Take 1 tablet by mouth daily.     Yes Historical Provider, MD  fluticasone Asencion Islam) 50 MCG/ACT nasal spray 2 sprays each nostril at bedtime 12/31/14  Yes Leandrew Koyanagi, MD  hydrochlorothiazide (MICROZIDE) 12.5 MG capsule TAKE ONE CAPSULE BY MOUTH DAILY 06/26/15  Yes Leandrew Koyanagi, MD  Multiple Vitamins-Minerals (VISION VITAMINS PO) Take  1 tablet by mouth daily.     Yes Historical Provider, MD  omeprazole (PRILOSEC) 10 MG capsule Take 10 mg by mouth daily.   Yes Historical Provider, MD  sildenafil (REVATIO) 20 MG tablet TAKE 2-5 TABLETS BY MOUTH ONCE DAILY AS NEEDED FOR DESIRED EFFECT 01/09/15  Yes Leandrew Koyanagi, MD  traZODone (DESYREL) 100 MG tablet Take 1 tablet (100 mg total) by mouth daily. 12/31/14  Yes Leandrew Koyanagi, MD  amphetamine-dextroamphetamine (ADDERALL) 20 MG tablet Take 1 tablet (20 mg total) by mouth 2 (two) times daily. For 60d after signed Patient not taking: Reported on 10/17/2015 09/28/15   Leandrew Koyanagi, MD  amphetamine-dextroamphetamine (ADDERALL)  20 MG tablet Take 1 tablet (20 mg total) by mouth 2 (two) times daily. Patient not taking: Reported on 10/17/2015 09/28/15   Leandrew Koyanagi, MD  ibuprofen (ADVIL,MOTRIN) 800 MG tablet Take 1 tablet (800 mg total) by mouth 3 (three) times daily. Patient not taking: Reported on 08/31/2015 05/18/15   Gloriann Loan, PA-C  meloxicam (MOBIC) 15 MG tablet Take 1 tablet (15 mg total) by mouth daily. Patient not taking: Reported on 10/17/2015 06/18/15   Leandrew Koyanagi, MD  predniSONE (DELTASONE) 20 MG tablet 4/3/3/2/2/1/1 single daily dose for 7 days Patient not taking: Reported on 10/17/2015 08/23/15   Leandrew Koyanagi, MD  sildenafil (REVATIO) 20 MG tablet TAKE 2-5 TABLETS BY MOUTH ONCE DAILY AS NEEDED FOR DESIRED EFFECT Patient not taking: Reported on 10/17/2015 01/14/15   Leandrew Koyanagi, MD   Social History   Social History  . Marital Status: Married    Spouse Name: N/A  . Number of Children: N/A  . Years of Education: N/A   Occupational History  . Not on file.   Social History Main Topics  . Smoking status: Never Smoker   . Smokeless tobacco: Never Used  . Alcohol Use: Yes     Comment: in the past but not current; as of 11/20/13 [facility] Survey sheet drink 16 oz  . Drug Use: No  . Sexual Activity: Yes   Other Topics Concern  . Not on file   Social History Narrative   Review of Systems  Musculoskeletal: Positive for myalgias and arthralgias.  Neurological: Negative for weakness and numbness.    Objective:   Physical Exam  Constitutional: He is oriented to person, place, and time. He appears well-developed and well-nourished. No distress.  HENT:  Head: Normocephalic and atraumatic.  Eyes: Pupils are equal, round, and reactive to light.  Neck: Normal range of motion.  Cardiovascular: Normal rate and regular rhythm.   Pulmonary/Chest: Effort normal. No respiratory distress.  Musculoskeletal: Normal range of motion.  Neurological: He is alert and oriented to person, place,  and time.  Skin: Skin is warm and dry.  Psychiatric: He has a normal mood and affect. His behavior is normal.  Nursing note and vitals reviewed.   Filed Vitals:   10/17/15 0821  BP: 138/96  Pulse: 77  Temp: 97.7 F (36.5 C)  TempSrc: Oral  Resp: 16  Height: 5\' 10"  (1.778 m)  Weight: 227 lb (102.967 kg)  SpO2: 97%    Assessment & Plan:    Shoulder pain, left  Pain in joint, lower leg, right  Pain in left knee  ADD  Allergic rhinitis  Meds ordered this encounter  Medications  . DISCONTD: HYDROcodone-acetaminophen (NORCO) 10-325 MG tablet    Sig: TAKE ONE TABLET BY MOUTH EVERY 6 HOURS AS NEEDED FOR post-op knee PAIN  Refill:  0  . HYDROcodone-acetaminophen (NORCO) 10-325 MG tablet    Sig: TAKE ONE TABLET BY MOUTH EVERY 6 HOURS AS NEEDED    Dispense:  60 tablet    Refill:  0  . amphetamine-dextroamphetamine (ADDERALL) 30 MG tablet    Sig: Take 1 tablet by mouth 2 (two) times daily.    Dispense:  60 tablet    Refill:  0  . fluticasone (FLONASE) 50 MCG/ACT nasal spray    Sig: 2 sprays each nostril at bedtime    Dispense:  16 g    Refill:  6  To use during travel to [place]. He has follow-up plans with orthopedics when he gets back.  I have completed the patient encounter in its entirety as documented by the scribe, with editing by me where necessary. Karmina Zufall P. Laney Pastor, M.D.

## 2015-10-17 NOTE — Patient Instructions (Addendum)
  miralax For constipation   IF you received an x-ray today, you will receive an invoice from [facility]. Please contact [facility] at [phone] with questions or concerns regarding your invoice.   IF you received labwork today, you will receive an invoice from [employer]. Please contact [facility] at [phone] with questions or concerns regarding your invoice.   Our billing staff will not be able to assist you with questions regarding bills from these companies.  You will be contacted with the lab results as soon as they are available. The fastest way to get your results is to activate your My Chart account. Instructions are located on the last page of this paperwork. If you have not heard from [place] regarding the results in 2 weeks, please contact this office.

## 2015-10-28 DIAGNOSIS — M1732 Unilateral post-traumatic osteoarthritis, left knee: Secondary | ICD-10-CM | POA: Diagnosis not present

## 2015-10-28 DIAGNOSIS — M25512 Pain in left shoulder: Secondary | ICD-10-CM | POA: Diagnosis not present

## 2015-10-28 DIAGNOSIS — Z9889 Other specified postprocedural states: Secondary | ICD-10-CM | POA: Diagnosis not present

## 2015-10-28 DIAGNOSIS — M2241 Chondromalacia patellae, right knee: Secondary | ICD-10-CM | POA: Diagnosis not present

## 2015-10-28 DIAGNOSIS — G8929 Other chronic pain: Secondary | ICD-10-CM | POA: Diagnosis not present

## 2015-10-28 DIAGNOSIS — M75112 Incomplete rotator cuff tear or rupture of left shoulder, not specified as traumatic: Secondary | ICD-10-CM | POA: Diagnosis not present

## 2015-10-28 DIAGNOSIS — M25612 Stiffness of left shoulder, not elsewhere classified: Secondary | ICD-10-CM | POA: Diagnosis not present

## 2015-10-28 DIAGNOSIS — R29898 Other symptoms and signs involving the musculoskeletal system: Secondary | ICD-10-CM | POA: Diagnosis not present

## 2015-10-28 DIAGNOSIS — S83222A Peripheral tear of medial meniscus, current injury, left knee, initial encounter: Secondary | ICD-10-CM | POA: Diagnosis not present

## 2015-10-28 DIAGNOSIS — Z7409 Other reduced mobility: Secondary | ICD-10-CM | POA: Diagnosis not present

## 2015-10-28 DIAGNOSIS — M6281 Muscle weakness (generalized): Secondary | ICD-10-CM | POA: Diagnosis not present

## 2015-10-28 DIAGNOSIS — M25561 Pain in right knee: Secondary | ICD-10-CM | POA: Diagnosis not present

## 2015-10-28 DIAGNOSIS — M1711 Unilateral primary osteoarthritis, right knee: Secondary | ICD-10-CM | POA: Diagnosis not present

## 2015-10-28 DIAGNOSIS — M7582 Other shoulder lesions, left shoulder: Secondary | ICD-10-CM | POA: Diagnosis not present

## 2015-11-04 DIAGNOSIS — M2241 Chondromalacia patellae, right knee: Secondary | ICD-10-CM | POA: Diagnosis not present

## 2015-11-04 DIAGNOSIS — M1711 Unilateral primary osteoarthritis, right knee: Secondary | ICD-10-CM | POA: Diagnosis not present

## 2015-11-04 DIAGNOSIS — M1712 Unilateral primary osteoarthritis, left knee: Secondary | ICD-10-CM | POA: Diagnosis not present

## 2015-11-04 DIAGNOSIS — M7582 Other shoulder lesions, left shoulder: Secondary | ICD-10-CM | POA: Diagnosis not present

## 2015-11-04 DIAGNOSIS — M25512 Pain in left shoulder: Secondary | ICD-10-CM | POA: Diagnosis not present

## 2015-11-04 DIAGNOSIS — M25562 Pain in left knee: Secondary | ICD-10-CM | POA: Diagnosis not present

## 2015-11-05 DIAGNOSIS — Z9889 Other specified postprocedural states: Secondary | ICD-10-CM | POA: Diagnosis not present

## 2015-11-05 DIAGNOSIS — M1711 Unilateral primary osteoarthritis, right knee: Secondary | ICD-10-CM | POA: Diagnosis not present

## 2015-11-05 DIAGNOSIS — M25561 Pain in right knee: Secondary | ICD-10-CM | POA: Diagnosis not present

## 2015-11-05 DIAGNOSIS — M25512 Pain in left shoulder: Secondary | ICD-10-CM | POA: Diagnosis not present

## 2015-11-05 DIAGNOSIS — M2241 Chondromalacia patellae, right knee: Secondary | ICD-10-CM | POA: Diagnosis not present

## 2015-11-05 DIAGNOSIS — G8929 Other chronic pain: Secondary | ICD-10-CM | POA: Diagnosis not present

## 2015-11-06 DIAGNOSIS — M75122 Complete rotator cuff tear or rupture of left shoulder, not specified as traumatic: Secondary | ICD-10-CM | POA: Diagnosis not present

## 2015-11-09 ENCOUNTER — Other Ambulatory Visit: Payer: Self-pay | Admitting: Internal Medicine

## 2015-11-10 DIAGNOSIS — G8929 Other chronic pain: Secondary | ICD-10-CM | POA: Diagnosis not present

## 2015-11-10 DIAGNOSIS — Z9889 Other specified postprocedural states: Secondary | ICD-10-CM | POA: Diagnosis not present

## 2015-11-10 DIAGNOSIS — M25561 Pain in right knee: Secondary | ICD-10-CM | POA: Diagnosis not present

## 2015-11-10 DIAGNOSIS — M2241 Chondromalacia patellae, right knee: Secondary | ICD-10-CM | POA: Diagnosis not present

## 2015-11-10 DIAGNOSIS — M1711 Unilateral primary osteoarthritis, right knee: Secondary | ICD-10-CM | POA: Diagnosis not present

## 2015-11-10 DIAGNOSIS — M25512 Pain in left shoulder: Secondary | ICD-10-CM | POA: Diagnosis not present

## 2015-11-13 DIAGNOSIS — M25512 Pain in left shoulder: Secondary | ICD-10-CM | POA: Diagnosis not present

## 2015-11-13 DIAGNOSIS — M1711 Unilateral primary osteoarthritis, right knee: Secondary | ICD-10-CM | POA: Diagnosis not present

## 2015-11-13 DIAGNOSIS — M2241 Chondromalacia patellae, right knee: Secondary | ICD-10-CM | POA: Diagnosis not present

## 2015-11-13 DIAGNOSIS — Z9889 Other specified postprocedural states: Secondary | ICD-10-CM | POA: Diagnosis not present

## 2015-11-13 DIAGNOSIS — M25561 Pain in right knee: Secondary | ICD-10-CM | POA: Diagnosis not present

## 2015-11-13 DIAGNOSIS — G8929 Other chronic pain: Secondary | ICD-10-CM | POA: Diagnosis not present

## 2015-11-16 DIAGNOSIS — M25512 Pain in left shoulder: Secondary | ICD-10-CM | POA: Diagnosis not present

## 2015-11-16 DIAGNOSIS — M1711 Unilateral primary osteoarthritis, right knee: Secondary | ICD-10-CM | POA: Diagnosis not present

## 2015-11-16 DIAGNOSIS — M2241 Chondromalacia patellae, right knee: Secondary | ICD-10-CM | POA: Diagnosis not present

## 2015-11-16 DIAGNOSIS — G8929 Other chronic pain: Secondary | ICD-10-CM | POA: Diagnosis not present

## 2015-11-16 DIAGNOSIS — Z9889 Other specified postprocedural states: Secondary | ICD-10-CM | POA: Diagnosis not present

## 2015-11-16 DIAGNOSIS — M25561 Pain in right knee: Secondary | ICD-10-CM | POA: Diagnosis not present

## 2015-11-17 ENCOUNTER — Other Ambulatory Visit: Payer: Self-pay | Admitting: Internal Medicine

## 2015-11-18 NOTE — Addendum Note (Signed)
Addended by: Mancel Bale on: 11/18/2015 02:35 PM   Modules accepted: Orders

## 2015-11-18 NOTE — Telephone Encounter (Signed)
Will someone please print and sign for dr Laney Pastor

## 2015-11-19 MED ORDER — AMPHETAMINE-DEXTROAMPHETAMINE 30 MG PO TABS: 30.0000 mg | ORAL_TABLET | Freq: Two times a day (BID) | ORAL | Status: DC

## 2015-11-19 NOTE — Addendum Note (Signed)
Addended by: Fara Chute on: 11/19/2015 11:15 PM   Modules accepted: Orders

## 2015-11-19 NOTE — Telephone Encounter (Signed)
Meds ordered this encounter  Medications  . amphetamine-dextroamphetamine (ADDERALL) 30 MG tablet    Sig: Take 1 tablet by mouth 2 (two) times daily.    Dispense:  60 tablet    Refill:  0    Order Specific Question:  Supervising Provider    Answer:  DOOLITTLE, ROBERT P [id number]

## 2015-11-20 NOTE — Telephone Encounter (Signed)
Notified pt on my chart.

## 2015-11-25 ENCOUNTER — Other Ambulatory Visit: Payer: Self-pay | Admitting: Internal Medicine

## 2015-11-26 ENCOUNTER — Other Ambulatory Visit: Payer: Self-pay | Admitting: Emergency Medicine

## 2015-11-27 DIAGNOSIS — S83222A Peripheral tear of medial meniscus, current injury, left knee, initial encounter: Secondary | ICD-10-CM | POA: Diagnosis not present

## 2015-11-27 DIAGNOSIS — M1732 Unilateral post-traumatic osteoarthritis, left knee: Secondary | ICD-10-CM | POA: Diagnosis not present

## 2015-11-27 DIAGNOSIS — M1711 Unilateral primary osteoarthritis, right knee: Secondary | ICD-10-CM | POA: Diagnosis not present

## 2015-11-27 DIAGNOSIS — M75112 Incomplete rotator cuff tear or rupture of left shoulder, not specified as traumatic: Secondary | ICD-10-CM | POA: Diagnosis not present

## 2015-11-27 DIAGNOSIS — M6281 Muscle weakness (generalized): Secondary | ICD-10-CM | POA: Diagnosis not present

## 2015-11-27 DIAGNOSIS — Z9889 Other specified postprocedural states: Secondary | ICD-10-CM | POA: Diagnosis not present

## 2015-11-27 DIAGNOSIS — M2241 Chondromalacia patellae, right knee: Secondary | ICD-10-CM | POA: Diagnosis not present

## 2015-11-27 DIAGNOSIS — M25612 Stiffness of left shoulder, not elsewhere classified: Secondary | ICD-10-CM | POA: Diagnosis not present

## 2015-11-27 DIAGNOSIS — G8929 Other chronic pain: Secondary | ICD-10-CM | POA: Diagnosis not present

## 2015-11-27 DIAGNOSIS — R29898 Other symptoms and signs involving the musculoskeletal system: Secondary | ICD-10-CM | POA: Diagnosis not present

## 2015-11-27 DIAGNOSIS — M25512 Pain in left shoulder: Secondary | ICD-10-CM | POA: Diagnosis not present

## 2015-11-27 DIAGNOSIS — M25561 Pain in right knee: Secondary | ICD-10-CM | POA: Diagnosis not present

## 2015-11-27 DIAGNOSIS — Z7409 Other reduced mobility: Secondary | ICD-10-CM | POA: Diagnosis not present

## 2015-11-27 DIAGNOSIS — S83231D Complex tear of medial meniscus, current injury, right knee, subsequent encounter: Secondary | ICD-10-CM | POA: Diagnosis not present

## 2015-11-27 DIAGNOSIS — M7582 Other shoulder lesions, left shoulder: Secondary | ICD-10-CM | POA: Diagnosis not present

## 2015-12-07 DIAGNOSIS — M2241 Chondromalacia patellae, right knee: Secondary | ICD-10-CM | POA: Diagnosis not present

## 2015-12-07 DIAGNOSIS — M25561 Pain in right knee: Secondary | ICD-10-CM | POA: Diagnosis not present

## 2015-12-07 DIAGNOSIS — G8929 Other chronic pain: Secondary | ICD-10-CM | POA: Diagnosis not present

## 2015-12-07 DIAGNOSIS — M25512 Pain in left shoulder: Secondary | ICD-10-CM | POA: Diagnosis not present

## 2015-12-07 DIAGNOSIS — M7582 Other shoulder lesions, left shoulder: Secondary | ICD-10-CM | POA: Diagnosis not present

## 2015-12-07 DIAGNOSIS — S83231D Complex tear of medial meniscus, current injury, right knee, subsequent encounter: Secondary | ICD-10-CM | POA: Diagnosis not present

## 2015-12-14 DIAGNOSIS — M7582 Other shoulder lesions, left shoulder: Secondary | ICD-10-CM | POA: Diagnosis not present

## 2015-12-14 DIAGNOSIS — M2241 Chondromalacia patellae, right knee: Secondary | ICD-10-CM | POA: Diagnosis not present

## 2015-12-14 DIAGNOSIS — M25512 Pain in left shoulder: Secondary | ICD-10-CM | POA: Diagnosis not present

## 2015-12-14 DIAGNOSIS — M25561 Pain in right knee: Secondary | ICD-10-CM | POA: Diagnosis not present

## 2015-12-14 DIAGNOSIS — S83231D Complex tear of medial meniscus, current injury, right knee, subsequent encounter: Secondary | ICD-10-CM | POA: Diagnosis not present

## 2015-12-14 DIAGNOSIS — G8929 Other chronic pain: Secondary | ICD-10-CM | POA: Diagnosis not present

## 2015-12-20 ENCOUNTER — Encounter: Payer: Self-pay | Admitting: Internal Medicine

## 2015-12-20 ENCOUNTER — Telehealth: Payer: Self-pay | Admitting: Physician Assistant

## 2015-12-23 MED ORDER — AMPHETAMINE-DEXTROAMPHETAMINE 30 MG PO TABS: 30.0000 mg | ORAL_TABLET | Freq: Two times a day (BID) | ORAL | Status: DC

## 2015-12-23 NOTE — Telephone Encounter (Signed)
Meds ordered this encounter  Medications  . amphetamine-dextroamphetamine (ADDERALL) 30 MG tablet    Sig: Take 1 tablet by mouth 2 (two) times daily.    Dispense:  60 tablet    Refill:  0    Order Specific Question:  Supervising Provider    Answer:  DOOLITTLE, ROBERT P [id number]

## 2015-12-23 NOTE — Addendum Note (Signed)
Addended by: Fara Chute on: 12/23/2015 02:46 PM   Modules accepted: Orders

## 2015-12-25 DIAGNOSIS — C61 Malignant neoplasm of prostate: Secondary | ICD-10-CM | POA: Diagnosis not present

## 2015-12-25 DIAGNOSIS — R351 Nocturia: Secondary | ICD-10-CM | POA: Diagnosis not present

## 2015-12-25 DIAGNOSIS — N5201 Erectile dysfunction due to arterial insufficiency: Secondary | ICD-10-CM | POA: Diagnosis not present

## 2015-12-28 ENCOUNTER — Other Ambulatory Visit: Payer: Self-pay | Admitting: Physician Assistant

## 2015-12-28 NOTE — Telephone Encounter (Signed)
Notified pt that Rx is ready on MyChart.

## 2016-01-01 DIAGNOSIS — S83231D Complex tear of medial meniscus, current injury, right knee, subsequent encounter: Secondary | ICD-10-CM | POA: Diagnosis not present

## 2016-01-01 DIAGNOSIS — M6281 Muscle weakness (generalized): Secondary | ICD-10-CM | POA: Diagnosis not present

## 2016-01-01 DIAGNOSIS — Z7409 Other reduced mobility: Secondary | ICD-10-CM | POA: Diagnosis not present

## 2016-01-01 DIAGNOSIS — M7582 Other shoulder lesions, left shoulder: Secondary | ICD-10-CM | POA: Diagnosis not present

## 2016-01-01 DIAGNOSIS — G8929 Other chronic pain: Secondary | ICD-10-CM | POA: Diagnosis not present

## 2016-01-01 DIAGNOSIS — M1711 Unilateral primary osteoarthritis, right knee: Secondary | ICD-10-CM | POA: Diagnosis not present

## 2016-01-01 DIAGNOSIS — M25561 Pain in right knee: Secondary | ICD-10-CM | POA: Diagnosis not present

## 2016-01-01 DIAGNOSIS — M75112 Incomplete rotator cuff tear or rupture of left shoulder, not specified as traumatic: Secondary | ICD-10-CM | POA: Diagnosis not present

## 2016-01-01 DIAGNOSIS — S83222A Peripheral tear of medial meniscus, current injury, left knee, initial encounter: Secondary | ICD-10-CM | POA: Diagnosis not present

## 2016-01-01 DIAGNOSIS — M25512 Pain in left shoulder: Secondary | ICD-10-CM | POA: Diagnosis not present

## 2016-01-01 DIAGNOSIS — M1732 Unilateral post-traumatic osteoarthritis, left knee: Secondary | ICD-10-CM | POA: Diagnosis not present

## 2016-01-01 DIAGNOSIS — M2241 Chondromalacia patellae, right knee: Secondary | ICD-10-CM | POA: Diagnosis not present

## 2016-01-01 DIAGNOSIS — M25612 Stiffness of left shoulder, not elsewhere classified: Secondary | ICD-10-CM | POA: Diagnosis not present

## 2016-01-01 DIAGNOSIS — R29898 Other symptoms and signs involving the musculoskeletal system: Secondary | ICD-10-CM | POA: Diagnosis not present

## 2016-01-01 DIAGNOSIS — Z9889 Other specified postprocedural states: Secondary | ICD-10-CM | POA: Diagnosis not present

## 2016-01-05 DIAGNOSIS — S83231D Complex tear of medial meniscus, current injury, right knee, subsequent encounter: Secondary | ICD-10-CM | POA: Diagnosis not present

## 2016-01-05 DIAGNOSIS — M25561 Pain in right knee: Secondary | ICD-10-CM | POA: Diagnosis not present

## 2016-01-05 DIAGNOSIS — M7582 Other shoulder lesions, left shoulder: Secondary | ICD-10-CM | POA: Diagnosis not present

## 2016-01-05 DIAGNOSIS — M2241 Chondromalacia patellae, right knee: Secondary | ICD-10-CM | POA: Diagnosis not present

## 2016-01-05 DIAGNOSIS — G8929 Other chronic pain: Secondary | ICD-10-CM | POA: Diagnosis not present

## 2016-01-05 DIAGNOSIS — M25512 Pain in left shoulder: Secondary | ICD-10-CM | POA: Diagnosis not present

## 2016-01-14 DIAGNOSIS — N5201 Erectile dysfunction due to arterial insufficiency: Secondary | ICD-10-CM | POA: Diagnosis not present

## 2016-01-19 DIAGNOSIS — M7582 Other shoulder lesions, left shoulder: Secondary | ICD-10-CM | POA: Diagnosis not present

## 2016-01-19 DIAGNOSIS — G8929 Other chronic pain: Secondary | ICD-10-CM | POA: Diagnosis not present

## 2016-01-19 DIAGNOSIS — M25512 Pain in left shoulder: Secondary | ICD-10-CM | POA: Diagnosis not present

## 2016-01-19 DIAGNOSIS — S83231D Complex tear of medial meniscus, current injury, right knee, subsequent encounter: Secondary | ICD-10-CM | POA: Diagnosis not present

## 2016-01-19 DIAGNOSIS — M2241 Chondromalacia patellae, right knee: Secondary | ICD-10-CM | POA: Diagnosis not present

## 2016-01-19 DIAGNOSIS — M25561 Pain in right knee: Secondary | ICD-10-CM | POA: Diagnosis not present

## 2016-01-23 ENCOUNTER — Telehealth: Payer: Self-pay | Admitting: Physician Assistant

## 2016-01-25 MED ORDER — AMPHETAMINE-DEXTROAMPHETAMINE 30 MG PO TABS: 30.0000 mg | ORAL_TABLET | Freq: Two times a day (BID) | ORAL | 0 refills | Status: DC

## 2016-01-25 NOTE — Telephone Encounter (Signed)
Meds ordered this encounter  Medications  . amphetamine-dextroamphetamine (ADDERALL) 30 MG tablet    Sig: Take 1 tablet by mouth 2 (two) times daily.    Dispense:  60 tablet    Refill:  0    Who will the patient follow-up with since Dr. Ninfa Meeker retirement? He needs to see that person for the next fill.

## 2016-01-25 NOTE — Telephone Encounter (Signed)
Notified pt ready. He stated that he is not sure who he will see. I advised that he will need to see his new provider for his next refill and suggested a few of our providers. Also gave him the name of [facility] who Dr Laney Pastor has referred several people to.

## 2016-01-26 ENCOUNTER — Other Ambulatory Visit: Payer: Self-pay

## 2016-01-26 MED ORDER — TRAZODONE HCL 100 MG PO TABS: 100.0000 mg | ORAL_TABLET | Freq: Every day | ORAL | 0 refills | Status: DC

## 2016-02-02 DIAGNOSIS — Z7409 Other reduced mobility: Secondary | ICD-10-CM | POA: Diagnosis not present

## 2016-02-02 DIAGNOSIS — M1711 Unilateral primary osteoarthritis, right knee: Secondary | ICD-10-CM | POA: Diagnosis not present

## 2016-02-02 DIAGNOSIS — M7582 Other shoulder lesions, left shoulder: Secondary | ICD-10-CM | POA: Diagnosis not present

## 2016-02-02 DIAGNOSIS — S83222A Peripheral tear of medial meniscus, current injury, left knee, initial encounter: Secondary | ICD-10-CM | POA: Diagnosis not present

## 2016-02-02 DIAGNOSIS — M6281 Muscle weakness (generalized): Secondary | ICD-10-CM | POA: Diagnosis not present

## 2016-02-02 DIAGNOSIS — M75112 Incomplete rotator cuff tear or rupture of left shoulder, not specified as traumatic: Secondary | ICD-10-CM | POA: Diagnosis not present

## 2016-02-02 DIAGNOSIS — M2241 Chondromalacia patellae, right knee: Secondary | ICD-10-CM | POA: Diagnosis not present

## 2016-02-02 DIAGNOSIS — G8929 Other chronic pain: Secondary | ICD-10-CM | POA: Diagnosis not present

## 2016-02-02 DIAGNOSIS — R29898 Other symptoms and signs involving the musculoskeletal system: Secondary | ICD-10-CM | POA: Diagnosis not present

## 2016-02-02 DIAGNOSIS — M25561 Pain in right knee: Secondary | ICD-10-CM | POA: Diagnosis not present

## 2016-02-02 DIAGNOSIS — Z9889 Other specified postprocedural states: Secondary | ICD-10-CM | POA: Diagnosis not present

## 2016-02-02 DIAGNOSIS — M1732 Unilateral post-traumatic osteoarthritis, left knee: Secondary | ICD-10-CM | POA: Diagnosis not present

## 2016-02-02 DIAGNOSIS — S83231D Complex tear of medial meniscus, current injury, right knee, subsequent encounter: Secondary | ICD-10-CM | POA: Diagnosis not present

## 2016-02-02 DIAGNOSIS — M25512 Pain in left shoulder: Secondary | ICD-10-CM | POA: Diagnosis not present

## 2016-02-02 DIAGNOSIS — M25612 Stiffness of left shoulder, not elsewhere classified: Secondary | ICD-10-CM | POA: Diagnosis not present

## 2016-02-10 ENCOUNTER — Telehealth: Payer: Self-pay

## 2016-02-10 NOTE — Telephone Encounter (Signed)
Pt would like a 10 supply of these scripts atorvastatin (LIPITOR) 40 MG tablet [id number] and benazepril(LOTENSIN) 40 MG tablet [id number]. He would like [place] to use the pharmacy at  [facility] in  [place]. Please advise at [phone]

## 2016-02-11 MED ORDER — ATORVASTATIN CALCIUM 40 MG PO TABS: 40.0000 mg | ORAL_TABLET | Freq: Every day | ORAL | 0 refills | Status: AC

## 2016-02-11 MED ORDER — BENAZEPRIL HCL 40 MG PO TABS: ORAL_TABLET | ORAL | 0 refills | Status: AC

## 2016-02-11 NOTE — Telephone Encounter (Signed)
Gave pt one month RF of each. Pt aware.

## 2016-02-17 DIAGNOSIS — E782 Mixed hyperlipidemia: Secondary | ICD-10-CM | POA: Diagnosis not present

## 2016-02-17 DIAGNOSIS — Z0000 Encounter for general adult medical examination without abnormal findings: Secondary | ICD-10-CM | POA: Diagnosis not present

## 2016-02-17 DIAGNOSIS — N529 Male erectile dysfunction, unspecified: Secondary | ICD-10-CM | POA: Diagnosis not present

## 2016-02-17 DIAGNOSIS — K219 Gastro-esophageal reflux disease without esophagitis: Secondary | ICD-10-CM | POA: Diagnosis not present

## 2016-02-17 DIAGNOSIS — F988 Other specified behavioral and emotional disorders with onset usually occurring in childhood and adolescence: Secondary | ICD-10-CM | POA: Diagnosis not present

## 2016-02-17 DIAGNOSIS — I10 Essential (primary) hypertension: Secondary | ICD-10-CM | POA: Diagnosis not present

## 2016-02-24 ENCOUNTER — Other Ambulatory Visit: Payer: Self-pay | Admitting: Urgent Care

## 2016-02-24 DIAGNOSIS — R739 Hyperglycemia, unspecified: Secondary | ICD-10-CM | POA: Diagnosis not present

## 2016-04-15 DIAGNOSIS — Z23 Encounter for immunization: Secondary | ICD-10-CM | POA: Diagnosis not present

## 2016-04-26 ENCOUNTER — Encounter: Payer: Self-pay | Admitting: Gastroenterology

## 2016-05-09 ENCOUNTER — Encounter: Payer: Self-pay | Admitting: Gastroenterology

## 2016-05-10 DIAGNOSIS — L570 Actinic keratosis: Secondary | ICD-10-CM | POA: Diagnosis not present

## 2016-05-10 DIAGNOSIS — D1801 Hemangioma of skin and subcutaneous tissue: Secondary | ICD-10-CM | POA: Diagnosis not present

## 2016-05-10 DIAGNOSIS — L821 Other seborrheic keratosis: Secondary | ICD-10-CM | POA: Diagnosis not present

## 2016-05-10 DIAGNOSIS — L814 Other melanin hyperpigmentation: Secondary | ICD-10-CM | POA: Diagnosis not present

## 2016-05-10 DIAGNOSIS — Z85828 Personal history of other malignant neoplasm of skin: Secondary | ICD-10-CM | POA: Diagnosis not present

## 2016-05-10 DIAGNOSIS — D2271 Melanocytic nevi of right lower limb, including hip: Secondary | ICD-10-CM | POA: Diagnosis not present

## 2016-05-31 DIAGNOSIS — H04123 Dry eye syndrome of bilateral lacrimal glands: Secondary | ICD-10-CM | POA: Diagnosis not present

## 2016-05-31 DIAGNOSIS — H35373 Puckering of macula, bilateral: Secondary | ICD-10-CM | POA: Diagnosis not present

## 2016-05-31 DIAGNOSIS — H353132 Nonexudative age-related macular degeneration, bilateral, intermediate dry stage: Secondary | ICD-10-CM | POA: Diagnosis not present

## 2016-08-11 DIAGNOSIS — H35373 Puckering of macula, bilateral: Secondary | ICD-10-CM | POA: Diagnosis not present

## 2016-08-11 DIAGNOSIS — H04123 Dry eye syndrome of bilateral lacrimal glands: Secondary | ICD-10-CM | POA: Diagnosis not present

## 2016-08-11 DIAGNOSIS — H5213 Myopia, bilateral: Secondary | ICD-10-CM | POA: Diagnosis not present

## 2016-08-11 DIAGNOSIS — H353132 Nonexudative age-related macular degeneration, bilateral, intermediate dry stage: Secondary | ICD-10-CM | POA: Diagnosis not present

## 2016-08-11 DIAGNOSIS — H524 Presbyopia: Secondary | ICD-10-CM | POA: Diagnosis not present

## 2016-08-11 DIAGNOSIS — H52223 Regular astigmatism, bilateral: Secondary | ICD-10-CM | POA: Diagnosis not present

## 2016-09-08 DIAGNOSIS — N5201 Erectile dysfunction due to arterial insufficiency: Secondary | ICD-10-CM | POA: Diagnosis not present

## 2016-09-19 DIAGNOSIS — G479 Sleep disorder, unspecified: Secondary | ICD-10-CM | POA: Diagnosis not present

## 2016-09-19 DIAGNOSIS — I10 Essential (primary) hypertension: Secondary | ICD-10-CM | POA: Diagnosis not present

## 2016-09-19 DIAGNOSIS — F900 Attention-deficit hyperactivity disorder, predominantly inattentive type: Secondary | ICD-10-CM | POA: Diagnosis not present

## 2016-12-19 DIAGNOSIS — I10 Essential (primary) hypertension: Secondary | ICD-10-CM | POA: Diagnosis not present

## 2016-12-19 DIAGNOSIS — J0100 Acute maxillary sinusitis, unspecified: Secondary | ICD-10-CM | POA: Diagnosis not present

## 2017-01-10 DIAGNOSIS — Z7951 Long term (current) use of inhaled steroids: Secondary | ICD-10-CM | POA: Diagnosis not present

## 2017-01-10 DIAGNOSIS — R0789 Other chest pain: Secondary | ICD-10-CM | POA: Diagnosis not present

## 2017-01-10 DIAGNOSIS — S29009A Unspecified injury of muscle and tendon of unspecified wall of thorax, initial encounter: Secondary | ICD-10-CM | POA: Diagnosis not present

## 2017-01-10 DIAGNOSIS — Z791 Long term (current) use of non-steroidal anti-inflammatories (NSAID): Secondary | ICD-10-CM | POA: Diagnosis not present

## 2017-01-10 DIAGNOSIS — S20212A Contusion of left front wall of thorax, initial encounter: Secondary | ICD-10-CM | POA: Diagnosis not present

## 2017-01-10 DIAGNOSIS — W1789XA Other fall from one level to another, initial encounter: Secondary | ICD-10-CM | POA: Diagnosis not present

## 2017-01-10 DIAGNOSIS — I10 Essential (primary) hypertension: Secondary | ICD-10-CM | POA: Diagnosis not present

## 2017-01-10 DIAGNOSIS — Z8673 Personal history of transient ischemic attack (TIA), and cerebral infarction without residual deficits: Secondary | ICD-10-CM | POA: Diagnosis not present

## 2017-01-10 DIAGNOSIS — R0781 Pleurodynia: Secondary | ICD-10-CM | POA: Diagnosis not present

## 2017-01-10 DIAGNOSIS — F909 Attention-deficit hyperactivity disorder, unspecified type: Secondary | ICD-10-CM | POA: Diagnosis not present

## 2017-01-10 DIAGNOSIS — Z79899 Other long term (current) drug therapy: Secondary | ICD-10-CM | POA: Diagnosis not present

## 2017-04-04 DIAGNOSIS — G479 Sleep disorder, unspecified: Secondary | ICD-10-CM | POA: Diagnosis not present

## 2017-04-04 DIAGNOSIS — F900 Attention-deficit hyperactivity disorder, predominantly inattentive type: Secondary | ICD-10-CM | POA: Diagnosis not present

## 2017-04-04 DIAGNOSIS — I10 Essential (primary) hypertension: Secondary | ICD-10-CM | POA: Diagnosis not present

## 2017-04-04 DIAGNOSIS — Z23 Encounter for immunization: Secondary | ICD-10-CM | POA: Diagnosis not present

## 2017-05-08 DIAGNOSIS — I10 Essential (primary) hypertension: Secondary | ICD-10-CM | POA: Diagnosis not present

## 2017-05-08 DIAGNOSIS — Z0000 Encounter for general adult medical examination without abnormal findings: Secondary | ICD-10-CM | POA: Diagnosis not present

## 2017-05-08 DIAGNOSIS — Z9181 History of falling: Secondary | ICD-10-CM | POA: Diagnosis not present

## 2017-05-16 DIAGNOSIS — L821 Other seborrheic keratosis: Secondary | ICD-10-CM | POA: Diagnosis not present

## 2017-05-16 DIAGNOSIS — L738 Other specified follicular disorders: Secondary | ICD-10-CM | POA: Diagnosis not present

## 2017-05-16 DIAGNOSIS — B078 Other viral warts: Secondary | ICD-10-CM | POA: Diagnosis not present

## 2017-05-16 DIAGNOSIS — D225 Melanocytic nevi of trunk: Secondary | ICD-10-CM | POA: Diagnosis not present

## 2017-05-16 DIAGNOSIS — L918 Other hypertrophic disorders of the skin: Secondary | ICD-10-CM | POA: Diagnosis not present

## 2017-05-16 DIAGNOSIS — L820 Inflamed seborrheic keratosis: Secondary | ICD-10-CM | POA: Diagnosis not present

## 2017-05-16 DIAGNOSIS — D3617 Benign neoplasm of peripheral nerves and autonomic nervous system of trunk, unspecified: Secondary | ICD-10-CM | POA: Diagnosis not present

## 2017-05-16 DIAGNOSIS — L720 Epidermal cyst: Secondary | ICD-10-CM | POA: Diagnosis not present

## 2017-05-16 DIAGNOSIS — Z85828 Personal history of other malignant neoplasm of skin: Secondary | ICD-10-CM | POA: Diagnosis not present

## 2017-05-16 DIAGNOSIS — D2262 Melanocytic nevi of left upper limb, including shoulder: Secondary | ICD-10-CM | POA: Diagnosis not present

## 2017-05-16 DIAGNOSIS — D485 Neoplasm of uncertain behavior of skin: Secondary | ICD-10-CM | POA: Diagnosis not present

## 2017-05-16 DIAGNOSIS — L570 Actinic keratosis: Secondary | ICD-10-CM | POA: Diagnosis not present

## 2017-06-23 DIAGNOSIS — M5126 Other intervertebral disc displacement, lumbar region: Secondary | ICD-10-CM | POA: Diagnosis not present

## 2017-06-23 DIAGNOSIS — M9903 Segmental and somatic dysfunction of lumbar region: Secondary | ICD-10-CM | POA: Diagnosis not present

## 2017-06-24 DIAGNOSIS — M5126 Other intervertebral disc displacement, lumbar region: Secondary | ICD-10-CM | POA: Diagnosis not present

## 2017-06-24 DIAGNOSIS — M9903 Segmental and somatic dysfunction of lumbar region: Secondary | ICD-10-CM | POA: Diagnosis not present
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
## 2017-09-26 ENCOUNTER — Encounter: Payer: Self-pay | Admitting: Gastroenterology

## 2017-11-02 ENCOUNTER — Ambulatory Visit (AMBULATORY_SURGERY_CENTER): Payer: Self-pay

## 2017-11-02 VITALS — Ht 70.0 in | Wt 234.6 lb

## 2017-11-02 DIAGNOSIS — Z86010 Personal history of colonic polyps: Secondary | ICD-10-CM

## 2017-11-02 MED ORDER — NA SULFATE-K SULFATE-MG SULF 17.5-3.13-1.6 GM/177ML PO SOLN: 1.0000 | Freq: Once | ORAL | 0 refills | Status: AC

## 2017-11-02 NOTE — Progress Notes (Signed)
Per pt, no allergies to soy or egg products.Pt not taking any weight loss meds or using  O2 at home.  Pt refused emmi video. 

## 2017-11-21 ENCOUNTER — Encounter: Payer: Self-pay | Admitting: Gastroenterology

## 2017-11-21 ENCOUNTER — Other Ambulatory Visit: Payer: Self-pay

## 2017-11-21 ENCOUNTER — Ambulatory Visit (AMBULATORY_SURGERY_CENTER): Payer: Medicare Other | Admitting: Gastroenterology

## 2017-11-21 VITALS — BP 140/91 | HR 70 | Temp 99.3°F | Resp 20 | Ht 70.0 in | Wt 234.0 lb

## 2017-11-21 DIAGNOSIS — D128 Benign neoplasm of rectum: Secondary | ICD-10-CM

## 2017-11-21 DIAGNOSIS — Z1212 Encounter for screening for malignant neoplasm of rectum: Secondary | ICD-10-CM | POA: Diagnosis not present

## 2017-11-21 DIAGNOSIS — D125 Benign neoplasm of sigmoid colon: Secondary | ICD-10-CM

## 2017-11-21 DIAGNOSIS — D124 Benign neoplasm of descending colon: Secondary | ICD-10-CM

## 2017-11-21 DIAGNOSIS — K621 Rectal polyp: Secondary | ICD-10-CM | POA: Diagnosis not present

## 2017-11-21 DIAGNOSIS — D129 Benign neoplasm of anus and anal canal: Secondary | ICD-10-CM

## 2017-11-21 DIAGNOSIS — D120 Benign neoplasm of cecum: Secondary | ICD-10-CM | POA: Diagnosis not present

## 2017-11-21 DIAGNOSIS — Z1211 Encounter for screening for malignant neoplasm of colon: Secondary | ICD-10-CM | POA: Diagnosis not present

## 2017-11-21 MED ORDER — SODIUM CHLORIDE 0.9 % IV SOLN: 500.0000 mL | Freq: Once | INTRAVENOUS | Status: DC
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
## 2017-11-21 NOTE — Progress Notes (Signed)
Report to PACU, RN, vss, BBS= Clear.  

## 2017-11-21 NOTE — Patient Instructions (Addendum)
Handout given for polyps,hemorrhoids, diverticulosis and high fiber  YOU HAD AN ENDOSCOPIC PROCEDURE TODAY AT [facility]:   Refer to the procedure report that was given to you for any specific questions about what was found during the examination.  If the procedure report does not answer your questions, please call your gastroenterologist to clarify.  If you requested that your care partner not be given the details of your procedure findings, then the procedure report has been included in a sealed envelope for you to review at your convenience later.  YOU SHOULD EXPECT: Some feelings of bloating in the abdomen. Passage of more gas than usual.  Walking can help get rid of the air that was put into your GI tract during the procedure and reduce the bloating. If you had a lower endoscopy (such as a colonoscopy or flexible sigmoidoscopy) you may notice spotting of blood in your stool or on the toilet paper. If you underwent a bowel prep for your procedure, you may not have a normal bowel movement for a few days.  Please Note:  You might notice some irritation and congestion in your nose or some drainage.  This is from the oxygen used during your procedure.  There is no need for concern and it should clear up in a day or so.  SYMPTOMS TO REPORT IMMEDIATELY:   Following lower endoscopy (colonoscopy or flexible sigmoidoscopy):  Excessive amounts of blood in the stool  Significant tenderness or worsening of abdominal pains  Swelling of the abdomen that is new, acute  Fever of 100F or higher   For urgent or emergent issues, a gastroenterologist can be reached at any hour by calling [phone].   DIET:  We do recommend a small meal at first, but then you may proceed to your regular diet.  Drink plenty of fluids but you should avoid alcoholic beverages for 24 hours.  ACTIVITY:  You should plan to take it easy for the rest of today and you should NOT DRIVE or use heavy machinery  until tomorrow (because of the sedation medicines used during the test).    FOLLOW UP: Our staff will call the number listed on your records the next business day following your procedure to check on you and address any questions or concerns that you may have regarding the information given to you following your procedure. If we do not reach you, we will leave a message.  However, if you are feeling well and you are not experiencing any problems, there is no need to return our call.  We will assume that you have returned to your regular daily activities without incident.  If any biopsies were taken you will be contacted by phone or by letter within the next 1-3 weeks.  Please call us at [phone] if you have not heard about the biopsies in 3 weeks.    SIGNATURES/CONFIDENTIALITY: You and/or your care partner have signed paperwork which will be entered into your electronic medical record.  These signatures attest to the fact that that the information above on your After Visit Summary has been reviewed and is understood.  Full responsibility of the confidentiality of this discharge information lies with you and/or your care-partner.

## 2017-11-21 NOTE — Op Note (Addendum)
[facility] Patient Name: Gary Parrish Procedure Date: 11/21/2017 1:02 PM MRN: [id number] Endoscopist: Ladene Artist , MD Age: 77 Referring MD:  Date of Birth: [birth year] Gender: Male Account #: [id number] Procedure:                Colonoscopy Indications:              Screening for colorectal malignant neoplasm Medicines:                Monitored Anesthesia Care Procedure:                Pre-Anesthesia Assessment:                           - Prior to the procedure, a History and Physical                            was performed, and patient medications and                            allergies were reviewed. The patient's tolerance of                            previous anesthesia was also reviewed. The risks                            and benefits of the procedure and the sedation                            options and risks were discussed with the patient.                            All questions were answered, and informed consent                            was obtained. Prior Anticoagulants: The patient has                            taken no previous anticoagulant or antiplatelet                            agents. ASA Grade Assessment: III - A patient with                            severe systemic disease. After reviewing the risks                            and benefits, the patient was deemed in                            satisfactory condition to undergo the procedure.                           After obtaining informed consent, the colonoscope  was passed under direct vision. Throughout the                            procedure, the patient's blood pressure, pulse, and                            oxygen saturations were monitored continuously. The                            Colonoscope was introduced through the anus and                            advanced to the the cecum, identified by                            appendiceal orifice and  ileocecal valve. The                            ileocecal valve, appendiceal orifice, and rectum                            were photographed. The quality of the bowel                            preparation was adequate after extensive lavage and                            suctioning. The colonoscopy was performed without                            difficulty. The patient tolerated the procedure                            well. Scope In: 1:07:26 PM Scope Out: 1:29:33 PM Total Procedure Duration: 0 hours 22 minutes 7 seconds  Findings:                 The perianal and digital rectal examinations were                            normal.                           A 4 mm polyp was found in the cecum. The polyp was                            sessile. The polyp was removed with a cold forceps.                            Resection and retrieval were complete.                           Two semi-pedunculated polyps were found in the  sigmoid colon and descending colon. The polyps were                            8 to 9 mm in size. These polyps were removed with a                            cold snare. Resection and retrieval were complete.                           Two sessile polyps were found in the rectum and                            sigmoid colon. The polyps were 6 to 7 mm in size.                            These polyps were removed with a cold snare.                            Resection and retrieval were complete.                           Multiple medium-mouthed diverticula were found in                            the left colon. There was narrowing of the colon in                            association with the diverticular opening. There                            was evidence of diverticular spasm.                            Peri-diverticular erythema was seen. There was no                            evidence of diverticular bleeding.                            Many medium-mouthed diverticula were found in the                            right colon. There was no evidence of diverticular                            bleeding.                           Internal hemorrhoids were found during                            retroflexion. The hemorrhoids were small and Grade  I (internal hemorrhoids that do not prolapse).                           The exam was otherwise without abnormality on                            direct and retroflexion views. Complications:            No immediate complications. Estimated blood loss:                            None. Estimated Blood Loss:     Estimated blood loss: none. Impression:               - One 4 mm polyp in the cecum, removed with a cold                            forceps. Resected and retrieved.                           - Two 8 to 9 mm polyps in the sigmoid colon and in                            the descending colon, removed with a cold snare.                            Resected and retrieved.                           - Two 6 to 7 mm polyps in the rectum and in the                            sigmoid colon, removed with a cold snare. Resected                            and retrieved.                           - Severe diverticulosis in the left colon. There                            was narrowing of the colon in association with the                            diverticular opening. There was evidence of                            diverticular spasm. Peri-diverticular erythema was                            seen. There was no evidence of diverticular                            bleeding.                           -  Moderate diverticulosis in the right colon. There                            was no evidence of diverticular bleeding.                           - Internal hemorrhoids.                           - The examination was otherwise normal on direct                             and retroflexion views. Recommendation:           - Repeat colonoscopy date to be determined after                            pending pathology results are reviewed for                            surveillance with a more extensive bowel prep.                           - Patient has a contact number available for                            emergencies. The signs and symptoms of potential                            delayed complications were discussed with the                            patient. Return to normal activities tomorrow.                            Written discharge instructions were provided to the                            patient.                           - High fiber diet.                           - Continue present medications.                           - Await pathology results. Ladene Artist, MD 11/21/2017 1:36:54 PM This report has been signed electronically.

## 2017-11-21 NOTE — Progress Notes (Signed)
Called to room to assist during endoscopic procedure.  Patient ID and intended procedure confirmed with present staff. Received instructions for my participation in the procedure from the performing physician.  

## 2017-11-22 ENCOUNTER — Telehealth: Payer: Self-pay | Admitting: *Deleted

## 2017-11-22 NOTE — Telephone Encounter (Signed)
  Follow up Call-  Call back number 11/21/2017  Post procedure Call Back phone  # [phone] or [phone]  Permission to leave phone message Yes  Some recent data might be hidden     Patient questions:  Do you have a fever, pain , or abdominal swelling? No. Pain Score  0 *  Have you tolerated food without any problems? Yes.    Have you been able to return to your normal activities? Yes.    Do you have any questions about your discharge instructions: Diet   No. Medications  No. Follow up visit  No.  Do you have questions or concerns about your Care? No.  Actions: * If pain score is 4 or above: No action needed, pain <4.pt mentioned that he had some bleeding with stool yesterday after home from procedure .Pt unable to tell me how much bleeding he had but said it was just the one time and is just getting up this morning so he will call back if still having bleeding to let us know how much bleeding.

## 2017-11-29 ENCOUNTER — Encounter: Payer: Self-pay | Admitting: Gastroenterology

## 2019-11-15 ENCOUNTER — Other Ambulatory Visit: Payer: Self-pay | Admitting: Urology

## 2020-01-08 ENCOUNTER — Encounter (HOSPITAL_BASED_OUTPATIENT_CLINIC_OR_DEPARTMENT_OTHER): Payer: Self-pay | Admitting: Urology

## 2020-01-08 NOTE — Progress Notes (Signed)
Spoke w/ via phone for pre-op interview---pt  Lab needs dos----  I stat 8, ekg               COVID test ------01-16-20 1200 pm Arrive at -------800 am 01-17-2020 NPO after MN NO Solid Food.  Clear liquids from MN until---700 am Medications to take morning of surgery -----atorvastatin, amlodipine, omeprazole Diabetic medication -----n/a Patient Special Instructions -----none Pre-Op special Istructions -----none Patient verbalized understanding of instructions that were given at this phone interview. Patient denies shortness of breath, chest pain, fever, cough a this phone interview.  Anesthesia : hx of cva 2013, no current neurologist no deficits from cva  PCP: dr Talbert Forest Gerlean Ren 10-31-2019 care everywhere and on chart Cardiologist :none Chest x-ray :none EKG : 07-05-2019 [facility] on chart Echo :none Cardiac Cath : none Activity level:  Works full time able to climb flight of steps without sob Sleep Study/ CPAP :none Fasting Blood Sugar :      / Checks Blood Sugar -- times a day:  n/a Blood Thinner/ Instructions /Last Dose:n/a ASA / Instructions/ Last Dose : pt instructed to stay on 325 am aspirin per dr Alyson Ingles, last dose will be day before surgery  Requested ekg from [facility] medical records by fax x 2 no ekg received, ekg ordered for day of surgery

## 2020-01-08 NOTE — Progress Notes (Signed)
NEW Covid Policy July 2263  Surgery Day:  01-17-20  Facility:  [facility]  Type of Surgery: penile prosthesisi insertion  Have you had Covid vaccine?no  Fully Covid Vaccinated:   No covid test 01-16-2020 1200 pm due to out of town until then  In the past 14 days:        Have you had any symptoms? no       Have you been tested covid positive?no       Have you been in contact with someone covid positive?no       Have you traveled internationally?no       Is pt Immuno-compromised?no

## 2020-01-13 NOTE — Progress Notes (Addendum)
ADDENDUM:  Received lab results dated 01-08-2020 (CBCdiff, CMP) via fax from pt's pcp office, [facility], placed w/ chart. Pt will not need [employer].  Received phone message from pt stating that he had seen his pcp as advised by pre-op nurse Zelphia Cairo RN, at pre-op interview on 01-08-2020 for ankle swelling.  Called and spoke w/ pt via phone.  Pt stated he had test done and results are not back yet and he is out of town. But will be back to have his covid test 01-16-2020 day before surgery.  Advised pt to follow his pcp advice , ok to proceed with surgery come in [place] , if pcp advises he should wait he needs to call dr Alyson Ingles office.  Pt verbalized understanding.

## 2020-01-16 ENCOUNTER — Encounter (HOSPITAL_BASED_OUTPATIENT_CLINIC_OR_DEPARTMENT_OTHER): Payer: Self-pay | Admitting: Anesthesiology

## 2020-01-16 ENCOUNTER — Inpatient Hospital Stay (HOSPITAL_COMMUNITY): Admission: RE | Admit: 2020-01-16 | Payer: Medicare Other | Source: Ambulatory Visit

## 2020-01-16 MED ORDER — VANCOMYCIN HCL 1500 MG/300ML IV SOLN
1500.0000 mg | INTRAVENOUS | Status: AC
  Filled 2020-01-16: qty 300

## 2020-01-16 NOTE — Progress Notes (Signed)
Patient called due to no evidence of covid test being completed today. He stated that he was unsure whether his surgery was going to be done as he had not heard from his physician regarding lab results from his medical visit at [facility] for swollen ankles. This medical clinic was called today and results have been faxed to the surgery center and placed on patients shadow chart. Dr. Alyson Ingles aware. Patient instructed to arrive at 0545 tomorrow for rapid covid test.

## 2020-01-17 ENCOUNTER — Ambulatory Visit (HOSPITAL_BASED_OUTPATIENT_CLINIC_OR_DEPARTMENT_OTHER): Admission: RE | Admit: 2020-01-17 | Payer: Medicare Other | Source: Home / Self Care | Admitting: Urology

## 2020-01-17 HISTORY — DX: Effusion, unspecified ankle: M25.473

## 2020-01-17 HISTORY — DX: Personal history of other specified conditions: Z87.898

## 2020-01-17 HISTORY — DX: Unspecified urinary incontinence: R32

## 2020-01-17 HISTORY — DX: Unspecified ptosis of unspecified eyelid: H02.409

## 2020-01-17 HISTORY — DX: Male erectile dysfunction, unspecified: N52.9

## 2020-01-17 SURGERY — INSERTION, PENILE PROSTHESIS, INFLATABLE
Anesthesia: General

## 2020-01-17 NOTE — Progress Notes (Signed)
0645: finally got in touch with Patient to remind him that he needed to come in early for  covid testing. Was concerned about swelling in legs. Asked to come on in and we would take a look at them when he arrived. He lives 45 minutes away. Will be here as soon as possible

## 2020-01-17 NOTE — Progress Notes (Signed)
Patient called back and cancelled due to swelling in legs never showed up

## 2020-05-27 DEATH — deceased

## 2021-03-03 ENCOUNTER — Encounter: Payer: Self-pay | Admitting: Gastroenterology

## 2023-05-19 NOTE — Patient Instructions (Addendum)
SURGICAL WAITING ROOM VISITATION  Patients having surgery or a procedure may have no more than 2 support people in the waiting area - these visitors may rotate.    Children under the age of 74 must have an adult with them who is not the patient.   If the patient needs to stay at the hospital during part of their recovery, the visitor guidelines for inpatient rooms apply. Pre-op nurse will coordinate an appropriate time for 1 support person to accompany patient in pre-op.  This support person may not rotate.    Please refer to the [facility] website for the visitor guidelines for Inpatients (after your surgery is over and you are in a regular room).       Your procedure is scheduled on: 06-05-23   Report to [facility] Main Entrance    Report to admitting at     10:10   AM   Call this number if you have problems the morning of surgery [phone]   Do not eat food :After Midnight.   After Midnight you may have the following liquids until _0940_____ AM/ DAY OF SURGERY  then nothing by mouth  Water Non-Citrus Juices (without pulp, NO RED-Apple, White grape, White cranberry) Black Coffee (NO MILK/CREAM OR CREAMERS, sugar ok)  Clear Tea (NO MILK/CREAM OR CREAMERS, sugar ok) regular and decaf                             Plain Jell-O (NO RED)                                           Fruit ices (not with fruit pulp, NO RED)                                     Popsicles (NO RED)                                                               Sports drinks like Gatorade (NO RED)                   The day of surgery:  Drink ONE (1) Pre-Surgery Clear Ensure  at      0920  AM the morning of surgery. Drink in one sitting. Do not sip.  This drink was given to you during your hospital  pre-op appointment visit. Nothing else to drink after completing the  Pre-Surgery Clear Ensure by 0940 am.          If you have questions, please contact your surgeon's office.   FOLLOW ANY  ADDITIONAL PRE OP INSTRUCTIONS YOU RECEIVED FROM YOUR SURGEON'S OFFICE!!!     Oral Hygiene is also important to reduce your risk of infection.                                    Remember - BRUSH YOUR TEETH THE MORNING OF SURGERY WITH YOUR REGULAR TOOTHPASTE  DENTURES WILL BE REMOVED  PRIOR TO SURGERY PLEASE DO NOT APPLY "Poly grip" OR ADHESIVES!!!   Do NOT smoke after Midnight   Stop all vitamins and herbal supplements 7 days before surgery.   Take these medicines the morning of surgery with A SIP OF WATER: omeprazole, atorvastatin, tylenol if needed  DO NOT TAKE ANY ORAL DIABETIC MEDICATIONS DAY OF YOUR SURGERY  Bring CPAP mask and tubing day of surgery.                              You may not have any metal on your body including hair pins, jewelry, and body piercing             Do not wear  lotions, powders, perfumes/cologne, or deodorant                Men may shave face and neck.   Do not bring valuables to the hospital. [facility] IS NOT             RESPONSIBLE   FOR VALUABLES.   Contacts, glasses, dentures or bridgework may not be worn into surgery.   Bring small overnight bag day of surgery.   DO NOT BRING YOUR HOME MEDICATIONS TO THE HOSPITAL. PHARMACY WILL DISPENSE MEDICATIONS LISTED ON YOUR MEDICATION LIST TO YOU DURING YOUR ADMISSION IN THE HOSPITAL!    Patients discharged on the day of surgery will not be allowed to drive home.  Someone NEEDS to stay with you for the first 24 hours after anesthesia.   Special Instructions: Bring a copy of your healthcare power of attorney and living will documents the day of surgery if you haven't scanned them before.              Please read over the following fact sheets you were given: IF YOU HAVE QUESTIONS ABOUT YOUR PRE-OP INSTRUCTIONS PLEASE CALL [phone]    If you test positive for Covid or have been in contact with anyone that has tested positive in the last 10 days please notify you surgeon.      Pre-operative  5 CHG Bath Instructions   You can play a key role in reducing the risk of infection after surgery. Your skin needs to be as free of germs as possible. You can reduce the number of germs on your skin by washing with CHG (chlorhexidine gluconate) soap before surgery. CHG is an antiseptic soap that kills germs and continues to kill germs even after washing.   DO NOT use if you have an allergy to chlorhexidine/CHG or antibacterial soaps. If your skin becomes reddened or irritated, stop using the CHG and notify one of our RNs at [phone].   Please shower with the CHG soap starting 4 days before surgery using the following schedule:     Please keep in mind the following:  DO NOT shave, including legs and underarms, starting the day of your first shower.   You may shave your face at any point before/day of surgery.  Place clean sheets on your bed the day you start using CHG soap. Use a clean washcloth (not used since being washed) for each shower. DO NOT sleep with pets once you start using the CHG.   CHG Shower Instructions:  If you choose to wash your hair and private area, wash first with your normal shampoo/soap.  After you use shampoo/soap, rinse your hair and body thoroughly to remove shampoo/soap residue.  Turn the water OFF and  apply about 3 tablespoons (45 ml) of CHG soap to a CLEAN washcloth.  Apply CHG soap ONLY FROM YOUR NECK DOWN TO YOUR TOES (washing for 3-5 minutes)  DO NOT use CHG soap on face, private areas, open wounds, or sores.  Pay special attention to the area where your surgery is being performed.  If you are having back surgery, having someone wash your back for you may be helpful. Wait 2 minutes after CHG soap is applied, then you may rinse off the CHG soap.  Pat dry with a clean towel  Put on clean clothes/pajamas   If you choose to wear lotion, please use ONLY the CHG-compatible lotions on the back of this paper.     Additional instructions for the day of  surgery: DO NOT APPLY any lotions, deodorants, cologne, or perfumes.   Put on clean/comfortable clothes.  Brush your teeth.  Ask your nurse before applying any prescription medications to the skin.      CHG Compatible Lotions   Aveeno Moisturizing lotion  Cetaphil Moisturizing Cream  Cetaphil Moisturizing Lotion  Clairol Herbal Essence Moisturizing Lotion, Dry Skin  Clairol Herbal Essence Moisturizing Lotion, Extra Dry Skin  Clairol Herbal Essence Moisturizing Lotion, Normal Skin  Curel Age Defying Therapeutic Moisturizing Lotion with Alpha Hydroxy  Curel Extreme Care Body Lotion  Curel Soothing Hands Moisturizing Hand Lotion  Curel Therapeutic Moisturizing Cream, Fragrance-Free  Curel Therapeutic Moisturizing Lotion, Fragrance-Free  Curel Therapeutic Moisturizing Lotion, Original Formula  Eucerin Daily Replenishing Lotion  Eucerin Dry Skin Therapy Plus Alpha Hydroxy Crme  Eucerin Dry Skin Therapy Plus Alpha Hydroxy Lotion  Eucerin Original Crme  Eucerin Original Lotion  Eucerin Plus Crme Eucerin Plus Lotion  Eucerin TriLipid Replenishing Lotion  Keri Anti-Bacterial Hand Lotion  Keri Deep Conditioning Original Lotion Dry Skin Formula Softly Scented  Keri Deep Conditioning Original Lotion, Fragrance Free Sensitive Skin Formula  Keri Lotion Fast Absorbing Fragrance Free Sensitive Skin Formula  Keri Lotion Fast Absorbing Softly Scented Dry Skin Formula  Keri Original Lotion  Keri Skin Renewal Lotion Keri Silky Smooth Lotion  Keri Silky Smooth Sensitive Skin Lotion  Nivea Body Creamy Conditioning Oil  Nivea Body Extra Enriched Lotion  Nivea Body Original Lotion  Nivea Body Sheer Moisturizing Lotion Nivea Crme  Nivea Skin Firming Lotion  NutraDerm 30 Skin Lotion  NutraDerm Skin Lotion  NutraDerm Therapeutic Skin Cream  NutraDerm Therapeutic Skin Lotion  ProShield Protective Hand Cream  Provon moisturizing lotion   Incentive Spirometer  An incentive spirometer is a  tool that can help keep your lungs clear and active. This tool measures how well you are filling your lungs with each breath. Taking long deep breaths may help reverse or decrease the chance of developing breathing (pulmonary) problems (especially infection) following: A long period of time when you are unable to move or be active. BEFORE THE PROCEDURE  If the spirometer includes an indicator to show your best effort, your nurse or respiratory therapist will set it to a desired goal. If possible, sit up straight or lean slightly forward. Try not to slouch. Hold the incentive spirometer in an upright position. INSTRUCTIONS FOR USE  Sit on the edge of your bed if possible, or sit up as far as you can in bed or on a chair. Hold the incentive spirometer in an upright position. Breathe out normally. Place the mouthpiece in your mouth and seal your lips tightly around it. Breathe in slowly and as deeply as possible, raising the piston or the ball  toward the top of the column. Hold your breath for 3-5 seconds or for as long as possible. Allow the piston or ball to fall to the bottom of the column. Remove the mouthpiece from your mouth and breathe out normally. Rest for a few seconds and repeat Steps 1 through 7 at least 10 times every 1-2 hours when you are awake. Take your time and take a few normal breaths between deep breaths. The spirometer may include an indicator to show your best effort. Use the indicator as a goal to work toward during each repetition. After each set of 10 deep breaths, practice coughing to be sure your lungs are clear. If you have an incision (the cut made at the time of surgery), support your incision when coughing by placing a pillow or rolled up towels firmly against it. Once you are able to get out of bed, walk around indoors and cough well. You may stop using the incentive spirometer when instructed by your caregiver.  RISKS AND COMPLICATIONS Take your time so you do not  get dizzy or light-headed. If you are in pain, you may need to take or ask for pain medication before doing incentive spirometry. It is harder to take a deep breath if you are having pain. AFTER USE Rest and breathe slowly and easily. It can be helpful to keep track of a log of your progress. Your caregiver can provide you with a simple table to help with this. If you are using the spirometer at home, follow these instructions: SEEK MEDICAL CARE IF:  You are having difficultly using the spirometer. You have trouble using the spirometer as often as instructed. Your pain medication is not giving enough relief while using the spirometer. You develop fever of 100.5 F (38.1 C) or higher. SEEK IMMEDIATE MEDICAL CARE IF:  You cough up bloody sputum that had not been present before. You develop fever of 102 F (38.9 C) or greater. You develop worsening pain at or near the incision site. MAKE SURE YOU:  Understand these instructions. Will watch your condition. Will get help right away if you are not doing well or get worse. Document Released: 10/24/2006 Document Revised: 09/05/2011 Document Reviewed: 12/25/2006 [employer] Patient Information 2014 [place], [place].   ________________________________________________________________________

## 2023-05-19 NOTE — Progress Notes (Addendum)
PCP - Kip Corrington,MD  LOV 03-13-23 epic Cardiologist -   PPM/ICD -  Device Orders -  Rep Notified -   Chest x-ray -  EKG -  Stress Test -  ECHO -  Cardiac Cath -   Sleep Study -  CPAP -   Fasting Blood Sugar -  Checks Blood Sugar _____ times a day  Last dose of GLP1 agonist-   GLP1 instructions:   Blood Thinner Instructions: Aspirin Instructions:  ERAS Protcol - PRE-SURGERY Ensure or G2-   COVID TEST-   Activity- Anesthesia review:   Patient denies shortness of breath, fever, cough and chest pain at PAT appointment   All instructions explained to the patient, with a verbal understanding of the material. Patient agrees to go over the instructions while at home for a better understanding. Patient also instructed to self quarantine after being tested for COVID-19. The opportunity to ask questions was provided.

## 2023-05-19 NOTE — H&P (Signed)
TOTAL KNEE ADMISSION H&P  Patient is being admitted for right total knee arthroplasty.  Subjective:  Chief Complaint: Right knee pain.  HPI: Gary Parrish, 82 y.o. male has a history of pain and functional disability in the right knee due to arthritis and has failed non-surgical conservative treatments for greater than 12 weeks to include NSAID's and/or analgesics, corticosteriod injections, and activity modification. Onset of symptoms was gradual, starting several years ago with gradually worsening course since that time. The patient noted no past surgery on the right knee.  Patient currently rates pain in the right knee at 8 out of 10 with activity. Patient has night pain, worsening of pain with activity and weight bearing, and pain that interferes with activities of daily living. Patient has evidence of  bone-on-bone arthritis medially and patellofemoral arthritis in both knees. The right side is slightly worse than the left.  There is no active infection.  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/31/2007   UNS ADVRS EFF UNS RX MEDICINAL&BIOLOGICAL SBSTNC 05/31/2007   History of colonic polyps 07/21/2006    Past Medical History:  Diagnosis Date   ADD (attention deficit disorder)    Allergy    seasonal   Ankle edema    right worse than left pt going to urgent care at pcp office to see about asap as of 01-08-2020   Arthritis    right knee   Cataract    Diverticulosis     Dysphagia    can choke on liquids occasionally   Erectile dysfunction    Eyelid drooping disease    both eyes right worse than left   Hemorrhoids    History of dysphasia    Hyperlipidemia    Hypertension    Lipoma of colon    Prostate cancer (HCC) 2009   Had treatment in [place]/Proton therapy 39 treatments   Squamous cell carcinoma    hand   Stroke (HCC) 2013   affected left side/ weakness of left side, no residual , no  neurologist seen  currently   Urinary incontinence    wears pads    Past Surgical History:  Procedure Laterality Date   COLONOSCOPY  fall 2020   exploratory surgery after cva  2013   EYE SURGERY  2010   Bil  due to cataract   hemorrhoidal banding  2000   internal   MENISCUS REPAIR  2015   right knee   PROSTATE SURGERY  2013/2014   in [place] proton radiation done    Prior to Admission medications   Medication Sig Start Date End Date Taking? Authorizing Provider  acetaminophen (TYLENOL) 650 MG CR tablet Take 650 mg by mouth 2 (two) times daily.     [provider]  amLODipine (NORVASC) 5 MG tablet Take 1 tablet (5 mg total) by mouth daily. 12/31/14   Tonye Pearson, MD  amphetamine-dextroamphetamine (ADDERALL) 30 MG tablet Take 1 tablet by mouth 2 (two) times daily. 01/25/16  Porfirio Oar, PA  aspirin 325 MG tablet Take 325 mg by mouth daily.    [provider]  atorvastatin (LIPITOR) 40 MG tablet Take 1 tablet (40 mg total) by mouth daily. 02/11/16   Wallis Bamberg, PA-C  benazepril (LOTENSIN) 40 MG tablet TAKE ONE TABLET BY MOUTH DAILY. 02/11/16   Wallis Bamberg, PA-C  FIBER PO Take 1 tablet by mouth daily.     [provider]  fluticasone (FLONASE) 50 MCG/ACT nasal spray 2 sprays each nostril at bedtime Patient taking differently: at bedtime as needed. 2 sprays each nostril at bedtime prn 10/17/15   Tonye Pearson, MD  hydrochlorothiazide (MICROZIDE) 12.5 MG capsule TAKE ONE CAPSULE BY MOUTH DAILY 06/26/15   Tonye Pearson, MD  HYDROcodone-acetaminophen [facility]) 10-325 MG tablet TAKE ONE TABLET BY MOUTH EVERY 6 HOURS AS NEEDED Patient not taking: Reported on 11/02/2017 10/17/15   Tonye Pearson, MD  Multiple Vitamins-Minerals (VISION VITAMINS PO) Take 1 tablet by mouth daily. 2 in am    [provider]  omeprazole (PRILOSEC) 10 MG capsule Take 10 mg by mouth daily.    [provider]  Simethicone (GAS-X PO) Take by mouth. Take 2 pills bid    [provider]  traZODone (DESYREL) 100 MG tablet Take 1 tablet (100 mg total) by mouth daily. Patient taking differently: Take 100 mg by mouth at bedtime. Takes 50 to 100 mg 01/26/16   Wallis Bamberg, PA-C  UNABLE TO FIND Brain supplement 1 per day    [provider]    No Known Allergies  Social History   Socioeconomic History   Marital status: Married    Spouse name: Not on file   Number of children: Not on file   Years of education: Not on file   Highest education level: Not on file  Occupational History   Not on file  Tobacco Use   Smoking status: Never   Smokeless tobacco: Never  Vaping Use   Vaping status: Never Used  Substance and Sexual Activity   Alcohol use: Yes    Alcohol/week: 14.0 standard drinks of alcohol    Types: 14 Standard drinks or equivalent per week   Drug use: No   Sexual activity: Yes  Other Topics Concern   Not on file  Social History Narrative   Not on file   Social Determinants of Health   Financial Resource Strain: Low Risk  (07/05/2022)   Received from [facility], [facility]   Overall Financial Resource Strain (CARDIA)    Difficulty of Paying Living Expenses: Not very hard  Food Insecurity: No Food Insecurity (07/05/2022)   Received from [facility], [facility]   Hunger Vital Sign    Worried About Running Out of Food in the Last Year: Never true    Ran Out of Food in the Last Year: Never true  Transportation Needs: No Transportation Needs (07/05/2022)   Received from  [facility], [facility]   PRAPARE - Transportation    Lack of Transportation (Medical): No    Lack of Transportation (Non-Medical): No  Physical Activity: Insufficiently Active (03/14/2022)   Received from [facility], [facility]   Exercise Vital Sign    Days of Exercise per Week: 2 days    Minutes of Exercise per Session: 30 min  Stress: Stress Concern Present (03/14/2022)   Received from [facility] of Occupational Health - Occupational Stress Questionnaire    Feeling of Stress :  To some extent  Social Connections: Unknown (03/16/2023)   Received from [facility]   Social Network    Social Network: Not on file  Intimate Partner Violence: Unknown (03/16/2023)   Received from [facility]   HITS    Physically Hurt: Not on file    Insult or Talk Down To: Not on file    Threaten Physical Harm: Not on file    Scream or Curse: Not on file    Tobacco Use: Low Risk  (04/21/2023)   Received from [facility]   Patient History    Smoking Tobacco Use: Never    Smokeless Tobacco Use: Never    Passive Exposure: Never   Social History   Substance and Sexual Activity  Alcohol Use Yes   Alcohol/week: 14.0 standard drinks of alcohol   Types: 14 Standard drinks or equivalent per week    Family History  Problem Relation Age of Onset   Breast cancer Mother    Stroke Father    Cancer Brother    Emphysema Brother    Prostate cancer Brother    Colon cancer Neg Hx     ROS  Objective:  Physical Exam: Well nourished and well developed.  General: Alert and oriented x3, cooperative and pleasant, no acute distress.  Head: normocephalic, atraumatic, neck supple.  Eyes: EOMI.  Abdomen: non-tender to palpation and soft, normoactive bowel sounds. Musculoskeletal: - Right knee No effusion, range of motion 5-125 degrees, moderate crepitus on range of motion, tender medially, no lateral tenderness or instability. - Left knee No effusion,  range of motion 0-130 degrees, moderate crepitus on range of motion, tender medially greater than laterally, no instability. - Gait pattern is antalgic on the right. Calves soft and nontender. Motor function intact in LE. Strength 5/5 LE bilaterally. Neuro: Distal pulses 2+. Sensation to light touch intact in LE.  Vital signs in last 24 hours: BP: ()/()  Arterial Line BP: ()/()   Imaging Review Plain radiographs demonstrate severe degenerative joint disease of the right knee. The overall alignment is neutral. The bone quality appears to be adequate for age and reported activity level.  Assessment/Plan:  End stage arthritis, right knee   The patient history, physical examination, clinical judgment of the provider and imaging studies are consistent with end stage degenerative joint disease of the right knee and total knee arthroplasty is deemed medically necessary. The treatment options including medical management, injection therapy arthroscopy and arthroplasty were discussed at length. The risks and benefits of total knee arthroplasty were presented and reviewed. The risks due to aseptic loosening, infection, stiffness, patella tracking problems, thromboembolic complications and other imponderables were discussed. The patient acknowledged the explanation, agreed to proceed with the plan and consent was signed. Patient is being admitted for inpatient treatment for surgery, pain control, PT, OT, prophylactic antibiotics, VTE prophylaxis, progressive ambulation and ADLs and discharge planning. The patient is planning to be discharged  home .  Patient's anticipated LOS is less than 2 midnights, meeting these requirements: - Lives within 1 hour of care - Has a competent adult at home to recover with post-op - NO history of  - Chronic pain requiring opiods  - Diabetes  - Coronary Artery Disease  - Heart failure  - Heart attack  - DVT/VTE  - Cardiac arrhythmia  - Respiratory Failure/COPD  -  Renal failure  - Anemia  - Advanced Liver disease  Therapy Plans: [facility] Disposition: Home with Caregiver Planned DVT Prophylaxis: Xarelto (hx TIA) DME Needed:  RW PCP: Delano Metz, MD (contacting for clearance) TXA: IV Allergies: NKDA Anesthesia Concerns: None BMI: 30.4 Last HgbA1c: not diabetic  Pharmacy: [facility] (deliver to room)  Other: -Given clearance form to take to PCP -Hx TIA in 2014  - Patient was instructed on what medications to stop prior to surgery. - Follow-up visit in 2 weeks with Dr. Lequita Halt - Begin physical therapy following surgery - Pre-operative lab work as pre-surgical testing - Prescriptions will be provided in hospital at time of discharge  R. Arcola Jansky, PA-C Orthopedic Surgery [facility] [place]

## 2023-05-29 ENCOUNTER — Encounter (HOSPITAL_COMMUNITY)
Admission: RE | Admit: 2023-05-29 | Discharge: 2023-05-29 | Disposition: A | Discharge status: Home / Self Care | Payer: Medicare Other | Source: Ambulatory Visit | Attending: Anesthesiology | Admitting: Anesthesiology

## 2023-05-29 DIAGNOSIS — Z01818 Encounter for other preprocedural examination: Secondary | ICD-10-CM

## 2023-05-29 DIAGNOSIS — I10 Essential (primary) hypertension: Secondary | ICD-10-CM

## 2023-05-31 NOTE — Progress Notes (Signed)
Patient prefers not to have blood. Prefer blood substitute. But would accept blood transfusion if absolutely necessary.  COVID Vaccine Completed: no  Date of COVID positive in last 90 days: no  PCP - Kip Corrington, MD Cardiologist - hasn't seen since 2016  Chest x-ray -  EKG -  Stress Test - years ago per pt ECHO - years ago per pt Cardiac Cath - n/a Pacemaker/ICD device last checked: n/a Spinal Cord Stimulator: n/a  Bowel Prep - no  Sleep Study - n/a CPAP -   Fasting Blood Sugar - n/a Checks Blood Sugar _____ times a day  Last dose of GLP1 agonist-  N/A GLP1 instructions:  Hold 7 days before surgery    Last dose of SGLT-2 inhibitors-  N/A SGLT-2 instructions:  Hold 3 days before surgery    Blood Thinner Instructions:  Time Aspirin Instructions: ASA 325 Last Dose:   Activity level: Can go up a flight of stairs and perform activities of daily living without stopping and without symptoms of chest pain or shortness of breath. Slow with stairs and needs rails due to knees  Anesthesia review: HTN, CVA, stroke with left sided weakness, edema, has a cough for last 3 months with white/yellowish sputum, PCP aware. Taking tamiflu per pt. Seeing PCP 06/01/23 for surgery clearance  Patient denies shortness of breath, fever, and chest pain at PAT appointment  Patient verbalized understanding of instructions that were given to them at the PAT appointment. Patient was also instructed that they will need to review over the PAT instructions again at home before surgery.

## 2023-05-31 NOTE — Patient Instructions (Signed)
SURGICAL WAITING ROOM VISITATION  Patients having surgery or a procedure may have no more than 2 support people in the waiting area - these visitors may rotate.    Children under the age of 69 must have an adult with them who is not the patient.  Due to an increase in RSV and influenza rates and associated hospitalizations, children ages 44 and under may not visit patients in [facility] hospitals.  If the patient needs to stay at the hospital during part of their recovery, the visitor guidelines for inpatient rooms apply. Pre-op nurse will coordinate an appropriate time for 1 support person to accompany patient in pre-op.  This support person may not rotate.    Please refer to the [facility] website for the visitor guidelines for Inpatients (after your surgery is over and you are in a regular room).    Your procedure is scheduled on: 06/05/23   Report to [facility] Main Entrance    Report to admitting at 10:10 AM   Call this number if you have problems the morning of surgery [phone]   Do not eat food :After Midnight.   After Midnight you may have the following liquids until 9:40 AM DAY OF SURGERY  Water Non-Citrus Juices (without pulp, NO RED-Apple, White grape, White cranberry) Black Coffee (NO MILK/CREAM OR CREAMERS, sugar ok)  Clear Tea (NO MILK/CREAM OR CREAMERS, sugar ok) regular and decaf                             Plain Jell-O (NO RED)                                           Fruit ices (not with fruit pulp, NO RED)                                     Popsicles (NO RED)                                                               Sports drinks like Gatorade (NO RED)                 The day of surgery:  Drink ONE (1) Pre-Surgery Clear Ensure at 9:40 AM the morning of surgery. Drink in one sitting. Do not sip.  This drink was given to you during your hospital  pre-op appointment visit. Nothing else to drink after completing the  Pre-Surgery Clear  Ensure          If you have questions, please contact your surgeon's office.   FOLLOW BOWEL PREP AND ANY ADDITIONAL PRE OP INSTRUCTIONS YOU RECEIVED FROM YOUR SURGEON'S OFFICE!!!     Oral Hygiene is also important to reduce your risk of infection.                                    Remember - BRUSH YOUR TEETH THE MORNING OF SURGERY WITH YOUR REGULAR TOOTHPASTE  DENTURES WILL  BE REMOVED PRIOR TO SURGERY PLEASE DO NOT APPLY "Poly grip" OR ADHESIVES!!!   Stop all vitamins and herbal supplements 7 days before surgery.   Take these medicines the morning of surgery with A SIP OF WATER: Omeprazole, Atorvastatin, Tylenol                              You may not have any metal on your body including jewelry, and body piercing             Do not wear lotions, powders, cologne, or deodorant              Men may shave face and neck.   Do not bring valuables to the hospital. [facility] IS NOT             RESPONSIBLE   FOR VALUABLES.   Contacts, glasses, dentures or bridgework may not be worn into surgery.   Bring small overnight bag day of surgery.   DO NOT BRING YOUR HOME MEDICATIONS TO THE HOSPITAL. PHARMACY WILL DISPENSE MEDICATIONS LISTED ON YOUR MEDICATION LIST TO YOU DURING YOUR ADMISSION IN THE HOSPITAL!   Special Instructions: Bring a copy of your healthcare power of attorney and living will documents the day of surgery if you haven't scanned them before.              Please read over the following fact sheets you were given: IF YOU HAVE QUESTIONS ABOUT YOUR PRE-OP INSTRUCTIONS PLEASE CALL [name]    If you received a COVID test during your pre-op visit  it is requested that you wear a mask when out in public, stay away from anyone that may not be feeling well and notify your surgeon if you develop symptoms. If you test positive for Covid or have been in contact with anyone that has tested positive in the last 10 days please notify you surgeon.      Pre-operative 5  CHG Bath Instructions   You can play a key role in reducing the risk of infection after surgery. Your skin needs to be as free of germs as possible. You can reduce the number of germs on your skin by washing with CHG (chlorhexidine gluconate) soap before surgery. CHG is an antiseptic soap that kills germs and continues to kill germs even after washing.   DO NOT use if you have an allergy to chlorhexidine/CHG or antibacterial soaps. If your skin becomes reddened or irritated, stop using the CHG and notify one of our RNs at [phone].   Please shower with the CHG soap starting 4 days before surgery using the following schedule:     Please keep in mind the following:  DO NOT shave, including legs and underarms, starting the day of your first shower.   You may shave your face at any point before/day of surgery.  Place clean sheets on your bed the day you start using CHG soap. Use a clean washcloth (not used since being washed) for each shower. DO NOT sleep with pets once you start using the CHG.   CHG Shower Instructions:  If you choose to wash your hair and private area, wash first with your normal shampoo/soap.  After you use shampoo/soap, rinse your hair and body thoroughly to remove shampoo/soap residue.  Turn the water OFF and apply about 3 tablespoons (45 ml) of CHG soap to a CLEAN washcloth.  Apply CHG soap ONLY FROM YOUR NECK DOWN  TO YOUR TOES (washing for 3-5 minutes)  DO NOT use CHG soap on face, private areas, open wounds, or sores.  Pay special attention to the area where your surgery is being performed.  If you are having back surgery, having someone wash your back for you may be helpful. Wait 2 minutes after CHG soap is applied, then you may rinse off the CHG soap.  Pat dry with a clean towel  Put on clean clothes/pajamas   If you choose to wear lotion, please use ONLY the CHG-compatible lotions on the back of this paper.     Additional instructions for the day of  surgery: DO NOT APPLY any lotions, deodorants, cologne, or perfumes.   Put on clean/comfortable clothes.  Brush your teeth.  Ask your nurse before applying any prescription medications to the skin.      CHG Compatible Lotions   Aveeno Moisturizing lotion  Cetaphil Moisturizing Cream  Cetaphil Moisturizing Lotion  Clairol Herbal Essence Moisturizing Lotion, Dry Skin  Clairol Herbal Essence Moisturizing Lotion, Extra Dry Skin  Clairol Herbal Essence Moisturizing Lotion, Normal Skin  Curel Age Defying Therapeutic Moisturizing Lotion with Alpha Hydroxy  Curel Extreme Care Body Lotion  Curel Soothing Hands Moisturizing Hand Lotion  Curel Therapeutic Moisturizing Cream, Fragrance-Free  Curel Therapeutic Moisturizing Lotion, Fragrance-Free  Curel Therapeutic Moisturizing Lotion, Original Formula  Eucerin Daily Replenishing Lotion  Eucerin Dry Skin Therapy Plus Alpha Hydroxy Crme  Eucerin Dry Skin Therapy Plus Alpha Hydroxy Lotion  Eucerin Original Crme  Eucerin Original Lotion  Eucerin Plus Crme Eucerin Plus Lotion  Eucerin TriLipid Replenishing Lotion  Keri Anti-Bacterial Hand Lotion  Keri Deep Conditioning Original Lotion Dry Skin Formula Softly Scented  Keri Deep Conditioning Original Lotion, Fragrance Free Sensitive Skin Formula  Keri Lotion Fast Absorbing Fragrance Free Sensitive Skin Formula  Keri Lotion Fast Absorbing Softly Scented Dry Skin Formula  Keri Original Lotion  Keri Skin Renewal Lotion Keri Silky Smooth Lotion  Keri Silky Smooth Sensitive Skin Lotion  Nivea Body Creamy Conditioning Oil  Nivea Body Extra Enriched Lotion  Nivea Body Original Lotion  Nivea Body Sheer Moisturizing Lotion Nivea Crme  Nivea Skin Firming Lotion  NutraDerm 30 Skin Lotion  NutraDerm Skin Lotion  NutraDerm Therapeutic Skin Cream  NutraDerm Therapeutic Skin Lotion  ProShield Protective Hand Cream  Provon moisturizing lotion   Incentive Spirometer  An incentive spirometer is a  tool that can help keep your lungs clear and active. This tool measures how well you are filling your lungs with each breath. Taking long deep breaths may help reverse or decrease the chance of developing breathing (pulmonary) problems (especially infection) following: A long period of time when you are unable to move or be active. BEFORE THE PROCEDURE  If the spirometer includes an indicator to show your best effort, your nurse or respiratory therapist will set it to a desired goal. If possible, sit up straight or lean slightly forward. Try not to slouch. Hold the incentive spirometer in an upright position. INSTRUCTIONS FOR USE  Sit on the edge of your bed if possible, or sit up as far as you can in bed or on a chair. Hold the incentive spirometer in an upright position. Breathe out normally. Place the mouthpiece in your mouth and seal your lips tightly around it. Breathe in slowly and as deeply as possible, raising the piston or the ball toward the top of the column. Hold your breath for 3-5 seconds or for as long as possible. Allow the piston or  ball to fall to the bottom of the column. Remove the mouthpiece from your mouth and breathe out normally. Rest for a few seconds and repeat Steps 1 through 7 at least 10 times every 1-2 hours when you are awake. Take your time and take a few normal breaths between deep breaths. The spirometer may include an indicator to show your best effort. Use the indicator as a goal to work toward during each repetition. After each set of 10 deep breaths, practice coughing to be sure your lungs are clear. If you have an incision (the cut made at the time of surgery), support your incision when coughing by placing a pillow or rolled up towels firmly against it. Once you are able to get out of bed, walk around indoors and cough well. You may stop using the incentive spirometer when instructed by your caregiver.  RISKS AND COMPLICATIONS Take your time so you do not  get dizzy or light-headed. If you are in pain, you may need to take or ask for pain medication before doing incentive spirometry. It is harder to take a deep breath if you are having pain. AFTER USE Rest and breathe slowly and easily. It can be helpful to keep track of a log of your progress. Your caregiver can provide you with a simple table to help with this. If you are using the spirometer at home, follow these instructions: SEEK MEDICAL CARE IF:  You are having difficultly using the spirometer. You have trouble using the spirometer as often as instructed. Your pain medication is not giving enough relief while using the spirometer. You develop fever of 100.5 F (38.1 C) or higher. SEEK IMMEDIATE MEDICAL CARE IF:  You cough up bloody sputum that had not been present before. You develop fever of 102 F (38.9 C) or greater. You develop worsening pain at or near the incision site. MAKE SURE YOU:  Understand these instructions. Will watch your condition. Will get help right away if you are not doing well or get worse. Document Released: 10/24/2006 Document Revised: 09/05/2011 Document Reviewed: 12/25/2006 [facility] Patient Information 2014 [place], [place].   ________________________________________________________________________

## 2023-06-01 ENCOUNTER — Other Ambulatory Visit: Payer: Self-pay

## 2023-06-01 ENCOUNTER — Encounter (HOSPITAL_COMMUNITY)
Admission: RE | Admit: 2023-06-01 | Discharge: 2023-06-01 | Disposition: A | Discharge status: Home / Self Care | Payer: Medicare Other | Source: Ambulatory Visit | Attending: Orthopedic Surgery | Admitting: Orthopedic Surgery

## 2023-06-01 ENCOUNTER — Encounter (HOSPITAL_COMMUNITY): Payer: Self-pay

## 2023-06-01 DIAGNOSIS — Z01818 Encounter for other preprocedural examination: Secondary | ICD-10-CM | POA: Diagnosis present

## 2023-06-01 DIAGNOSIS — I10 Essential (primary) hypertension: Secondary | ICD-10-CM | POA: Insufficient documentation

## 2023-06-01 HISTORY — DX: Depression, unspecified: F32.A

## 2023-06-01 HISTORY — DX: Atherosclerotic heart disease of native coronary artery without angina pectoris: I25.10

## 2023-06-01 LAB — CBC
HCT: 43.1 % (ref 39.0–52.0)
Hemoglobin: 13.4 g/dL (ref 13.0–17.0)
MCH: 27.7 pg (ref 26.0–34.0)
MCHC: 31.1 g/dL (ref 30.0–36.0)
MCV: 89.2 fL (ref 80.0–100.0)
Platelets: 316 10*3/uL (ref 150–400)
RBC: 4.83 MIL/uL (ref 4.22–5.81)
RDW: 14.2 % (ref 11.5–15.5)
WBC: 9.2 10*3/uL (ref 4.0–10.5)
nRBC: 0 % (ref 0.0–0.2)

## 2023-06-01 LAB — BASIC METABOLIC PANEL
Anion gap: 8 (ref 5–15)
BUN: 17 mg/dL (ref 8–23)
CO2: 23 mmol/L (ref 22–32)
Calcium: 10.2 mg/dL (ref 8.9–10.3)
Chloride: 108 mmol/L (ref 98–111)
Creatinine, Ser: 0.92 mg/dL (ref 0.61–1.24)
GFR, Estimated: >60 mL/min (ref >60)
Glucose, Bld: 102 mg/dL — ABNORMAL HIGH (ref 70–99)
Potassium: 4.1 mmol/L (ref 3.5–5.1)
Sodium: 139 mmol/L (ref 135–145)

## 2023-06-01 LAB — SURGICAL PCR SCREEN
MRSA, PCR: NEGATIVE
Staphylococcus aureus: NEGATIVE

## 2023-06-02 ENCOUNTER — Encounter (HOSPITAL_COMMUNITY): Payer: Self-pay | Admitting: Physician Assistant

## 2023-06-05 ENCOUNTER — Ambulatory Visit (HOSPITAL_COMMUNITY): Admission: RE | Admit: 2023-06-05 | Payer: Medicare Other | Source: Home / Self Care | Admitting: Orthopedic Surgery

## 2023-06-05 ENCOUNTER — Encounter (HOSPITAL_COMMUNITY): Admission: RE | Payer: Self-pay | Source: Home / Self Care

## 2023-06-05 SURGERY — ARTHROPLASTY, KNEE, TOTAL
Anesthesia: Choice | Site: Knee | Laterality: Right

## 2023-07-04 NOTE — H&P (Addendum)
 TOTAL KNEE ADMISSION H&P  Patient is being admitted for right total knee arthroplasty.  Subjective:  Chief Complaint: Right knee pain.  HPI: Gary Parrish, 83 y.o. male has a history of pain and functional disability in the right knee due to arthritis and has failed non-surgical conservative treatments for greater than 12 weeks to include NSAID's and/or analgesics and activity modification. Onset of symptoms was gradual, starting  several  years ago with gradually worsening course since that time. The patient noted no past surgery on the right knee.  Patient currently rates pain in the right knee at 8 out of 10 with activity. Patient has night pain, worsening of pain with activity and weight bearing, pain with passive range of motion, and crepitus. Patient has evidence of  bone-on-bone arthritis medially and patellofemoral arthritis in both knees. The right side is slightly worse than the left  by imaging studies. There is no active infection.  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/31/2007   UNS ADVRS EFF UNS RX MEDICINAL&BIOLOGICAL SBSTNC 05/31/2007   History of colonic polyps 07/21/2006    Past Medical History:  Diagnosis Date   ADD (attention deficit disorder)    Allergy    seasonal   Ankle edema    right worse than left pt going to urgent care at pcp office to see about asap as of 01-08-2020   Arthritis    right knee   Cataract    Coronary artery disease     Depression    Diverticulosis    Dysphagia    can choke on liquids occasionally   Erectile dysfunction    Eyelid drooping disease    both eyes right worse than left   Hemorrhoids    History of dysphasia    Hyperlipidemia    Hypertension    Lipoma of colon    Prostate cancer (HCC) 2009   Had treatment in [place] /Proton therapy 39 treatments   Squamous cell carcinoma    hand   Stroke (HCC) 2013   affected left side/ weakness of left side, no residual , no  neurologist seen  currently   Urinary incontinence    wears pads    Past Surgical History:  Procedure Laterality Date   COLONOSCOPY  fall 2020   exploratory surgery after cva  2013   EYE SURGERY  2010   Bil  due to cataract   hemorrhoidal banding  2000   internal   MENISCUS REPAIR  2015   right knee   PROSTATE SURGERY  2013/2014   in [place]  proton radiation done    Prior to Admission medications   Medication Sig Start Date End Date Taking? Authorizing Provider  acetaminophen  (TYLENOL ) 650 MG CR tablet Take 1,300 mg by mouth at bedtime.    [provider]  amphetamine -dextroamphetamine  (ADDERALL) 20 MG tablet Take 20 mg by mouth 3 (three) times daily.    [provider]  aspirin 325 MG tablet Take 325 mg  by mouth daily.    [provider]  atorvastatin  (LIPITOR) 40 MG tablet Take 1 tablet (40 mg total) by mouth daily. 02/11/16   Christopher Savannah, PA-C  benazepril  (LOTENSIN ) 40 MG tablet TAKE ONE TABLET BY MOUTH DAILY. 02/11/16   Christopher Savannah, PA-C  Chlorphen-Pseudoephed-APAP [facility] FLU/COLD PO) Take 1 packet by mouth daily as needed (cold symptoms).    [provider]  FIBER PO Take 1 tablet by mouth daily.     [provider]  fluticasone  (FLONASE ) 50 MCG/ACT nasal spray 2 sprays each nostril at bedtime Patient taking differently: Place 2 sprays into both nostrils daily as needed for allergies. 10/17/15   Joylene Lamar SQUIBB, MD  hydrochlorothiazide  (MICROZIDE ) 12.5 MG capsule TAKE  ONE CAPSULE BY MOUTH DAILY Patient taking differently: Take 25 mg by mouth daily. 06/26/15   Joylene Lamar SQUIBB, MD  omeprazole (PRILOSEC) 20 MG capsule Take 20 mg by mouth daily.    [provider]  Polyethyl Glycol-Propyl Glycol (LUBRICATING EYE DROPS OP) Place 1 drop into both eyes daily as needed (dry eyes).    [provider]  traZODone  (DESYREL ) 150 MG tablet Take 75 mg by mouth at bedtime as needed for sleep.    [provider]    No Known Allergies  Social History   Socioeconomic History   Marital status: Married    Spouse name: Not on file   Number of children: Not on file   Years of education: Not on file   Highest education level: Not on file  Occupational History   Not on file  Tobacco Use   Smoking status: Never   Smokeless tobacco: Never  Vaping Use   Vaping status: Never Used  Substance and Sexual Activity   Alcohol  use: Yes    Alcohol /week: 5.0 standard drinks of alcohol     Types: 5 Standard drinks or equivalent per week   Drug use: Yes    Frequency: 1.0 times per week    Types: Marijuana   Sexual activity: Yes  Other Topics Concern   Not on file  Social History Narrative   Not on file   Social Drivers of Health   Financial Resource Strain: Low Risk  (06/01/2023)   Received from [facility]   Overall Financial Resource Strain (CARDIA)    Difficulty of Paying Living Expenses: Not hard at all  Food Insecurity: No Food Insecurity (06/01/2023)   Received from [facility]   Hunger Vital Sign    Worried About Running Out of Food in the Last Year: Never true    Ran Out of Food in the Last Year: Never true  Transportation Needs: No Transportation Needs (06/01/2023)   Received from [facility] - Transportation    Lack of Transportation (Medical): No    Lack of Transportation (Non-Medical): No  Physical Activity: Unknown (06/01/2023)   Received from [facility]   Exercise Vital Sign    Days of Exercise per Week: 0  days    Minutes of Exercise per Session: Not on file  Stress: No Stress Concern Present (06/01/2023)   Received from [facility] - Occupational Stress Questionnaire    Feeling of Stress : Not at all  Social Connections: Moderately Integrated (06/01/2023)   Received from [facility]   Social Network    How would you rate your social network (family, work, friends)?: Adequate participation with social networks  Intimate Partner Violence: Not At Risk (06/01/2023)  Received from [facility]   HITS    Over the last 12 months how often did your partner physically hurt you?: Never    Over the last 12 months how often did your partner insult you or talk down to you?: Never    Over the last 12 months how often did your partner threaten you with physical harm?: Never    Over the last 12 months how often did your partner scream or curse at you?: Never    Tobacco Use: Low Risk  (06/09/2023)   Received from [facility]   Patient History    Smoking Tobacco Use: Never    Smokeless Tobacco Use: Never    Passive Exposure: Never   Social History   Substance and Sexual Activity  Alcohol  Use Yes   Alcohol /week: 5.0 standard drinks of alcohol    Types: 5 Standard drinks or equivalent per week    Family History  Problem Relation Age of Onset   Breast cancer Mother    Stroke Father    Cancer Brother    Emphysema Brother    Prostate cancer Brother    Colon cancer Neg Hx     Review of Systems  Constitutional:  Negative for chills and fever.  HENT:  Negative for congestion, sore throat and tinnitus.   Eyes:  Negative for double vision, photophobia and pain.  Respiratory:  Negative for cough, shortness of breath and wheezing.   Cardiovascular:  Negative for chest pain, palpitations and orthopnea.  Gastrointestinal:  Negative for heartburn, nausea and vomiting.  Genitourinary:  Negative for dysuria, frequency and urgency.  Musculoskeletal:   Positive for joint pain.  Neurological:  Negative for dizziness, weakness and headaches.    Objective:  Physical Exam: Well nourished and well developed.  General: Alert and oriented x3, cooperative and pleasant, no acute distress.  Head: normocephalic, atraumatic, neck supple.  Eyes: EOMI.  Musculoskeletal:  Right knee No effusion, range of motion 5-125 degrees, moderate crepitus on range of motion, tender medially, no lateral tenderness or instability.  Calves soft and nontender. Motor function intact in LE. Strength 5/5 LE bilaterally. Neuro: Distal pulses 2+. Sensation to light touch intact in LE.   Imaging Review Plain radiographs demonstrate severe degenerative joint disease of the right knee. The overall alignment is neutral. The bone quality appears to be adequate for age and reported activity level.  Assessment/Plan:  End stage arthritis, right knee   The patient history, physical examination, clinical judgment of the provider and imaging studies are consistent with end stage degenerative joint disease of the right knee and total knee arthroplasty is deemed medically necessary. The treatment options including medical management, injection therapy arthroscopy and arthroplasty were discussed at length. The risks and benefits of total knee arthroplasty were presented and reviewed. The risks due to aseptic loosening, infection, stiffness, patella tracking problems, thromboembolic complications and other imponderables were discussed. The patient acknowledged the explanation, agreed to proceed with the plan and consent was signed. Patient is being admitted for inpatient treatment for surgery, pain control, PT, OT, prophylactic antibiotics, VTE prophylaxis, progressive ambulation and ADLs and discharge planning. The patient is planning to be discharged  home .   Patient's anticipated LOS is less than 2 midnights, meeting these requirements: - Lives within 1 hour of care - Has a  competent adult at home to recover with post-op recover - NO history of  - Chronic pain requiring opiods  - Diabetes  - Coronary Artery Disease  - Heart failure  -  Heart attack  - DVT/VTE  - Cardiac arrhythmia  - Respiratory Failure/COPD  - Renal failure  - Anemia  - Advanced Liver disease  Therapy Plans: Outpatient therapy at [facility] Disposition: Home with caregiver Planned DVT Prophylaxis: Xarelto  10 mg QD (hx TIA) DME Needed: [facility] PCP: Dr. Bobbette Cardiologist: Redell Montenegro, MD (clearance received) TXA: IV Allergies: NKDA Metal Allergy: None Anesthesia Concerns: None BMI: 31.2 Last HgbA1c: Not diabetic Pain Regimen: Oxycodone , tramadol  Pharmacy: [facility] (have brought to room)  Other: - Surgery originally postponed due to need for cardiac clearance.  - Patient signed refusal for receiving blood products at preop visit  - Patient was instructed on what medications to stop prior to surgery. - Follow-up visit in 2 weeks with Dr. Melodi - Begin physical therapy following surgery - Pre-operative lab work as pre-surgical testing - Prescriptions will be provided in hospital at time of discharge  Roxie Mess, PA-C Orthopedic Surgery [facility] [place]

## 2023-07-17 NOTE — Patient Instructions (Addendum)
SURGICAL WAITING ROOM VISITATION Patients having surgery or a procedure may have no more than 2 support people in the waiting area - these visitors may rotate.    Children under the age of 63 must have an adult with them who is not the patient.  Due to an increase in RSV and influenza rates and associated hospitalizations, children ages 27 and under may not visit patients in [facility] hospitals.   If the patient needs to stay at the hospital during part of their recovery, the visitor guidelines for inpatient rooms apply. Pre-op nurse will coordinate an appropriate time for 1 support person to accompany patient in pre-op.  This support person may not rotate.    Please refer to the [facility] website for the visitor guidelines for Inpatients (after your surgery is over and you are in a regular room).       Your procedure is scheduled on: 07-31-23   Report to [facility] Main Entrance    Report to admitting at 8:00 AM   Call this number if you have problems the morning of surgery [phone]   Do not eat food :After Midnight.   After Midnight you may have the following liquids until 7:30 AM DAY OF SURGERY  Water Non-Citrus Juices (without pulp, NO RED-Apple, White grape, White cranberry) Black Coffee (NO MILK/CREAM OR CREAMERS, sugar ok)  Clear Tea (NO MILK/CREAM OR CREAMERS, sugar ok) regular and decaf                             Plain Jell-O (NO RED)                                           Fruit ices (not with fruit pulp, NO RED)                                     Popsicles (NO RED)                                                               Sports drinks like Gatorade (NO RED)                   The day of surgery:  Drink ONE (1) Pre-Surgery Clear Ensure by 7:30 AM the morning of surgery. Drink in one sitting. Do not sip.  This drink was given to you during your hospital  pre-op appointment visit. Nothing else to drink after completing the Pre-Surgery Clear  Ensure .          If you have questions, please contact your surgeon's office.   FOLLOW  ANY ADDITIONAL PRE OP INSTRUCTIONS YOU RECEIVED FROM YOUR SURGEON'S OFFICE!!!     Oral Hygiene is also important to reduce your risk of infection.                                    Remember - BRUSH YOUR TEETH THE MORNING OF SURGERY WITH YOUR REGULAR TOOTHPASTE  Do NOT smoke after Midnight   Take these medicines the morning of surgery with A SIP OF WATER:   Atorvastatin  Omeprazole  Flonase nasal spray  Tylenol if needed  Stop all vitamins and herbal supplements 7 days before surgery  Bring CPAP mask and tubing day of surgery.                              You may not have any metal on your body including jewelry            Do not wear lotions, powders, cologne, or deodorant              Men may shave face and neck.   Do not bring valuables to the hospital. [facility] IS NOT RESPONSIBLE   FOR VALUABLES.   Contacts, dentures or bridgework may not be worn into surgery.   Bring small overnight bag day of surgery.   DO NOT BRING YOUR HOME MEDICATIONS TO THE HOSPITAL. PHARMACY WILL DISPENSE MEDICATIONS LISTED ON YOUR MEDICATION LIST TO YOU DURING YOUR ADMISSION IN THE HOSPITAL!     Special Instructions: Bring a copy of your healthcare power of attorney and living will documents the day of surgery if you haven't scanned them before.              Please read over the following fact sheets you were given: IF YOU HAVE QUESTIONS ABOUT YOUR PRE-OP INSTRUCTIONS PLEASE CALL [phone]  If you received a COVID test during your pre-op visit  it is requested that you wear a mask when out in public, stay away from anyone that may not be feeling well and notify your surgeon if you develop symptoms. If you test positive for Covid or have been in contact with anyone that has tested positive in the last 10 days please notify you surgeon.    Pre-operative 5 CHG Bath Instructions   You can play a key  role in reducing the risk of infection after surgery. Your skin needs to be as free of germs as possible. You can reduce the number of germs on your skin by washing with CHG (chlorhexidine gluconate) soap before surgery. CHG is an antiseptic soap that kills germs and continues to kill germs even after washing.   DO NOT use if you have an allergy to chlorhexidine/CHG or antibacterial soaps. If your skin becomes reddened or irritated, stop using the CHG and notify one of our RNs at [phone].   Please shower with the CHG soap starting 4 days before surgery using the following schedule:     Please keep in mind the following:  DO NOT shave, including legs and underarms, starting the day of your first shower.   You may shave your face at any point before/day of surgery.  Place clean sheets on your bed the day you start using CHG soap. Use a clean washcloth (not used since being washed) for each shower. DO NOT sleep with pets once you start using the CHG.   CHG Shower Instructions:  If you choose to wash your hair and private area, wash first with your normal shampoo/soap.  After you use shampoo/soap, rinse your hair and body thoroughly to remove shampoo/soap residue.  Turn the water OFF and apply about 3 tablespoons (45 ml) of CHG soap to a CLEAN washcloth.  Apply CHG soap ONLY FROM YOUR NECK DOWN TO YOUR TOES (washing for 3-5 minutes)  DO NOT  use CHG soap on face, private areas, open wounds, or sores.  Pay special attention to the area where your surgery is being performed.  If you are having back surgery, having someone wash your back for you may be helpful. Wait 2 minutes after CHG soap is applied, then you may rinse off the CHG soap.  Pat dry with a clean towel  Put on clean clothes/pajamas   If you choose to wear lotion, please use ONLY the CHG-compatible lotions on the back of this paper.     Additional instructions for the day of surgery: DO NOT APPLY any lotions, deodorants,  cologne, or perfumes.   Put on clean/comfortable clothes.  Brush your teeth.  Ask your nurse before applying any prescription medications to the skin.      CHG Compatible Lotions   Aveeno Moisturizing lotion  Cetaphil Moisturizing Cream  Cetaphil Moisturizing Lotion  Clairol Herbal Essence Moisturizing Lotion, Dry Skin  Clairol Herbal Essence Moisturizing Lotion, Extra Dry Skin  Clairol Herbal Essence Moisturizing Lotion, Normal Skin  Curel Age Defying Therapeutic Moisturizing Lotion with Alpha Hydroxy  Curel Extreme Care Body Lotion  Curel Soothing Hands Moisturizing Hand Lotion  Curel Therapeutic Moisturizing Cream, Fragrance-Free  Curel Therapeutic Moisturizing Lotion, Fragrance-Free  Curel Therapeutic Moisturizing Lotion, Original Formula  Eucerin Daily Replenishing Lotion  Eucerin Dry Skin Therapy Plus Alpha Hydroxy Crme  Eucerin Dry Skin Therapy Plus Alpha Hydroxy Lotion  Eucerin Original Crme  Eucerin Original Lotion  Eucerin Plus Crme Eucerin Plus Lotion  Eucerin TriLipid Replenishing Lotion  Keri Anti-Bacterial Hand Lotion  Keri Deep Conditioning Original Lotion Dry Skin Formula Softly Scented  Keri Deep Conditioning Original Lotion, Fragrance Free Sensitive Skin Formula  Keri Lotion Fast Absorbing Fragrance Free Sensitive Skin Formula  Keri Lotion Fast Absorbing Softly Scented Dry Skin Formula  Keri Original Lotion  Keri Skin Renewal Lotion Keri Silky Smooth Lotion  Keri Silky Smooth Sensitive Skin Lotion  Nivea Body Creamy Conditioning Oil  Nivea Body Extra Enriched Lotion  Nivea Body Original Lotion  Nivea Body Sheer Moisturizing Lotion Nivea Crme  Nivea Skin Firming Lotion  NutraDerm 30 Skin Lotion  NutraDerm Skin Lotion  NutraDerm Therapeutic Skin Cream  NutraDerm Therapeutic Skin Lotion  ProShield Protective Hand Cream  Provon moisturizing lotion   PATIENT SIGNATURE_________________________________  NURSE  SIGNATURE__________________________________  ________________________________________________________________________    Gary Parrish  An incentive spirometer is a tool that can help keep your lungs clear and active. This tool measures how well you are filling your lungs with each breath. Taking long deep breaths may help reverse or decrease the chance of developing breathing (pulmonary) problems (especially infection) following: A long period of time when you are unable to move or be active. BEFORE THE PROCEDURE  If the spirometer includes an indicator to show your best effort, your nurse or respiratory therapist will set it to a desired goal. If possible, sit up straight or lean slightly forward. Try not to slouch. Hold the incentive spirometer in an upright position. INSTRUCTIONS FOR USE  Sit on the edge of your bed if possible, or sit up as far as you can in bed or on a chair. Hold the incentive spirometer in an upright position. Breathe out normally. Place the mouthpiece in your mouth and seal your lips tightly around it. Breathe in slowly and as deeply as possible, raising the piston or the ball toward the top of the column. Hold your breath for 3-5 seconds or for as long as possible. Allow the piston or  ball to fall to the bottom of the column. Remove the mouthpiece from your mouth and breathe out normally. Rest for a few seconds and repeat Steps 1 through 7 at least 10 times every 1-2 hours when you are awake. Take your time and take a few normal breaths between deep breaths. The spirometer may include an indicator to show your best effort. Use the indicator as a goal to work toward during each repetition. After each set of 10 deep breaths, practice coughing to be sure your lungs are clear. If you have an incision (the cut made at the time of surgery), support your incision when coughing by placing a pillow or rolled up towels firmly against it. Once you are able to get out of  bed, walk around indoors and cough well. You may stop using the incentive spirometer when instructed by your caregiver.  RISKS AND COMPLICATIONS Take your time so you do not get dizzy or light-headed. If you are in pain, you may need to take or ask for pain medication before doing incentive spirometry. It is harder to take a deep breath if you are having pain. AFTER USE Rest and breathe slowly and easily. It can be helpful to keep track of a log of your progress. Your caregiver can provide you with a simple table to help with this. If you are using the spirometer at home, follow these instructions: SEEK MEDICAL CARE IF:  You are having difficultly using the spirometer. You have trouble using the spirometer as often as instructed. Your pain medication is not giving enough relief while using the spirometer. You develop fever of 100.5 F (38.1 C) or higher. SEEK IMMEDIATE MEDICAL CARE IF:  You cough up bloody sputum that had not been present before. You develop fever of 102 F (38.9 C) or greater. You develop worsening pain at or near the incision site. MAKE SURE YOU:  Understand these instructions. Will watch your condition. Will get help right away if you are not doing well or get worse. Document Released: 10/24/2006 Document Revised: 09/05/2011 Document Reviewed: 12/25/2006 [facility] Patient Information 2014 [place], [place].   ________________________________________________________________________

## 2023-07-18 ENCOUNTER — Encounter (HOSPITAL_COMMUNITY)
Admission: RE | Admit: 2023-07-18 | Discharge: 2023-07-18 | Disposition: A | Discharge status: Home / Self Care | Payer: Medicare Other | Source: Ambulatory Visit | Attending: Family Medicine | Admitting: Family Medicine

## 2023-07-18 DIAGNOSIS — Z01818 Encounter for other preprocedural examination: Secondary | ICD-10-CM

## 2023-07-18 DIAGNOSIS — I10 Essential (primary) hypertension: Secondary | ICD-10-CM

## 2023-07-18 NOTE — Patient Instructions (Signed)
SURGICAL WAITING ROOM VISITATION  Patients having surgery or a procedure may have no more than 2 support people in the waiting area - these visitors may rotate.    Children under the age of 54 must have an adult with them who is not the patient.  Due to an increase in RSV and influenza rates and associated hospitalizations, children ages 67 and under may not visit patients in [facility] hospitals.  Visitors with respiratory illnesses are discouraged from visiting and should remain at home.  If the patient needs to stay at the hospital during part of their recovery, the visitor guidelines for inpatient rooms apply. Pre-op nurse will coordinate an appropriate time for 1 support person to accompany patient in pre-op.  This support person may not rotate.    Please refer to the [facility] website for the visitor guidelines for Inpatients (after your surgery is over and you are in a regular room).       Your procedure is scheduleduled on 07/31/2023.     Report to [facility] Main Entrance    Report to admitting at  0800 AM   Call this number if you have problems the morning of surgery [phone]   Do not eat food :After Midnight.   After Midnight you may have the following liquids until _ 0731_____ AM DAY OF SURGERY  Water Non-Citrus Juices (without pulp, NO RED-Apple, White grape, White cranberry) Black Coffee (NO MILK/CREAM OR CREAMERS, sugar ok)  Clear Tea (NO MILK/CREAM OR CREAMERS, sugar ok) regular and decaf                             Plain Jell-O (NO RED)                                           Fruit ices (not with fruit pulp, NO RED)                                     Popsicles (NO RED)                                                               Sports drinks like Gatorade (NO RED)                      The day of surgery:  Drink ONE (1) Pre-Surgery Clear Ensure or G2 at   0730AM the morning of surgery. Drink in one sitting. Do not sip.  This drink was  given to you during your hospital  pre-op appointment visit. Nothing else to drink after completing the  Pre-Surgery Clear Ensure or G2.          If you have questions, please contact your surgeon's office.      Oral Hygiene is also important to reduce your risk of infection.                                    Remember -  BRUSH YOUR TEETH THE MORNING OF SURGERY WITH YOUR REGULAR TOOTHPASTE  DENTURES WILL BE REMOVED PRIOR TO SURGERY PLEASE DO NOT APPLY "Poly grip" OR ADHESIVES!!!   Do NOT smoke after Midnight   Stop all vitamins and herbal supplements 7 days before surgery.   Take these medicines the morning of surgery with A SIP OF WATER:  omeprazole  DO NOT TAKE ANY ORAL DIABETIC MEDICATIONS DAY OF YOUR SURGERY  Bring CPAP mask and tubing day of surgery.                              You may not have any metal on your body including hair pins, jewelry, and body piercing             Do not wear make-up, lotions, powders, perfumes/cologne, or deodorant  Do not wear nail polish including gel and S&S, artificial/acrylic nails, or any other type of covering on natural nails including finger and toenails. If you have artificial nails, gel coating, etc. that needs to be removed by a nail salon please have this removed prior to surgery or surgery may need to be canceled/ delayed if the surgeon/ anesthesia feels like they are unable to be safely monitored.   Do not shave  48 hours prior to surgery.               Men may shave face and neck.   Do not bring valuables to the hospital. [facility] IS NOT             RESPONSIBLE   FOR VALUABLES.   Contacts, glasses, dentures or bridgework may not be worn into surgery.   Bring small overnight bag day of surgery.   DO NOT BRING YOUR HOME MEDICATIONS TO THE HOSPITAL. PHARMACY WILL DISPENSE MEDICATIONS LISTED ON YOUR MEDICATION LIST TO YOU DURING YOUR ADMISSION IN THE HOSPITAL!    Patients discharged on the day of surgery will not be allowed  to drive home.  Someone NEEDS to stay with you for the first 24 hours after anesthesia.   Special Instructions: Bring a copy of your healthcare power of attorney and living will documents the day of surgery if you haven't scanned them before.              Please read over the following fact sheets you were given: IF YOU HAVE QUESTIONS ABOUT YOUR PRE-OP INSTRUCTIONS PLEASE CALL [phone]   If you received a COVID test during your pre-op visit  it is requested that you wear a mask when out in public, stay away from anyone that may not be feeling well and notify your surgeon if you develop symptoms. If you test positive for Covid or have been in contact with anyone that has tested positive in the last 10 days please notify you surgeon.      Pre-operative 5 CHG Bath Instructions   You can play a key role in reducing the risk of infection after surgery. Your skin needs to be as free of germs as possible. You can reduce the number of germs on your skin by washing with CHG (chlorhexidine gluconate) soap before surgery. CHG is an antiseptic soap that kills germs and continues to kill germs even after washing.   DO NOT use if you have an allergy to chlorhexidine/CHG or antibacterial soaps. If your skin becomes reddened or irritated, stop using the CHG and notify one of our RNs at [phone].  Please shower with the CHG soap starting 4 days before surgery using the following schedule:     Please keep in mind the following:  DO NOT shave, including legs and underarms, starting the day of your first shower.   You may shave your face at any point before/day of surgery.  Place clean sheets on your bed the day you start using CHG soap. Use a clean washcloth (not used since being washed) for each shower. DO NOT sleep with pets once you start using the CHG.   CHG Shower Instructions:  If you choose to wash your hair and private area, wash first with your normal shampoo/soap.  After you use  shampoo/soap, rinse your hair and body thoroughly to remove shampoo/soap residue.  Turn the water OFF and apply about 3 tablespoons (45 ml) of CHG soap to a CLEAN washcloth.  Apply CHG soap ONLY FROM YOUR NECK DOWN TO YOUR TOES (washing for 3-5 minutes)  DO NOT use CHG soap on face, private areas, open wounds, or sores.  Pay special attention to the area where your surgery is being performed.  If you are having back surgery, having someone wash your back for you may be helpful. Wait 2 minutes after CHG soap is applied, then you may rinse off the CHG soap.  Pat dry with a clean towel  Put on clean clothes/pajamas   If you choose to wear lotion, please use ONLY the CHG-compatible lotions on the back of this paper.     Additional instructions for the day of surgery: DO NOT APPLY any lotions, deodorants, cologne, or perfumes.   Put on clean/comfortable clothes.  Brush your teeth.  Ask your nurse before applying any prescription medications to the skin.      CHG Compatible Lotions   Aveeno Moisturizing lotion  Cetaphil Moisturizing Cream  Cetaphil Moisturizing Lotion  Clairol Herbal Essence Moisturizing Lotion, Dry Skin  Clairol Herbal Essence Moisturizing Lotion, Extra Dry Skin  Clairol Herbal Essence Moisturizing Lotion, Normal Skin  Curel Age Defying Therapeutic Moisturizing Lotion with Alpha Hydroxy  Curel Extreme Care Body Lotion  Curel Soothing Hands Moisturizing Hand Lotion  Curel Therapeutic Moisturizing Cream, Fragrance-Free  Curel Therapeutic Moisturizing Lotion, Fragrance-Free  Curel Therapeutic Moisturizing Lotion, Original Formula  Eucerin Daily Replenishing Lotion  Eucerin Dry Skin Therapy Plus Alpha Hydroxy Crme  Eucerin Dry Skin Therapy Plus Alpha Hydroxy Lotion  Eucerin Original Crme  Eucerin Original Lotion  Eucerin Plus Crme Eucerin Plus Lotion  Eucerin TriLipid Replenishing Lotion  Keri Anti-Bacterial Hand Lotion  Keri Deep Conditioning Original Lotion Dry  Skin Formula Softly Scented  Keri Deep Conditioning Original Lotion, Fragrance Free Sensitive Skin Formula  Keri Lotion Fast Absorbing Fragrance Free Sensitive Skin Formula  Keri Lotion Fast Absorbing Softly Scented Dry Skin Formula  Keri Original Lotion  Keri Skin Renewal Lotion Keri Silky Smooth Lotion  Keri Silky Smooth Sensitive Skin Lotion  Nivea Body Creamy Conditioning Oil  Nivea Body Extra Enriched Teacher, adult education Moisturizing Lotion Nivea Crme  Nivea Skin Firming Lotion  NutraDerm 30 Skin Lotion  NutraDerm Skin Lotion  NutraDerm Therapeutic Skin Cream  NutraDerm Therapeutic Skin Lotion  ProShield Protective Hand Cream  Provon moisturizing lotion

## 2023-07-18 NOTE — Progress Notes (Addendum)
Anesthesia Review:  PCP: Kip Corrington LOV 06/09/23  Cardiologist :Kip Corrington LOV 06/09/23  Chest x-ray : EKG : 06/01/23  Echo : Stress test: Cardiac Cath :  Activity level: cannot do a flight of stairs without difficutly  Sleep Study/ CPAP : none  Fasting Blood Sugar :      / Checks Blood Sugar -- times a day:   Blood Thinner/ Instructions /Last Dose: ASA / Instructions/ Last Dose :    Surgery cancelled on 06/05/23    06/01/23- hgba1c- 5.6    Caregiver, Selena Batten Justice brought pt to preop appt.  She is with pt 5 days per week per pt.  [facility] did ot come in for preop appt.  Called caregiver and she stated she's did not need to come in for preop appt.   PT was able to answer questions in regards to med hx.  PT stated at preop appt that " I do not want any blood unless absolutely necessary."  PT signed blood refusal consent form at preop witnessed by 2 RNs.     Blood Refusal-  Consent faxed to [facility] Placed in allergies Placed in FYI  Order placed in Orders   Consent Faxed to DR Aluisio   2 RNs witnesssed pt completion of Blood Refusal Consent Form.     Calcium 10.4 on preop labs on 07/24/23.  Routed to DR Hazeline Junker [facility] aware     PT wears depends for unrinary incontence per pt.

## 2023-07-18 NOTE — Progress Notes (Signed)
COVID Vaccine received:  []  No []  Yes Date of any COVID positive Test in last 90 days:  PCP - KiP CORRINGTON Cardiologist -  Neuro- Delanna Notice  Chest x-ray -  EKG -   Stress Test - 04/26/98 Epic ECHO - 11/07/12 [facility] Cardiac Cath -   Bowel Prep - []  No  []   Yes ______  Pacemaker / ICD device []  No []  Yes   Spinal Cord Stimulator:[]  No []  Yes       History of Sleep Apnea? []  No []  Yes   CPAP used?- []  No []  Yes    Does the patient monitor blood sugar?          []  No []  Yes  []  N/A  Patient has: []  NO Hx DM   []  Pre-DM                 []  DM1  []   DM2 Does patient have a [employer] or [employer]? []  No []  Yes   Fasting Blood Sugar Ranges-  Checks Blood Sugar _____ times a day  GLP1 agonist / usual dose -  GLP1 instructions:  SGLT-2 inhibitors / usual dose -  SGLT-2 instructions:   Blood Thinner / Instructions: Aspirin Instructions:  Comments:   Activity level: Patient is able / unable to climb a flight of stairs without difficulty; []  No CP  []  No SOB, but would have ___   Patient can / can not perform ADLs without assistance.   Anesthesia review:   Patient denies shortness of breath, fever, cough and chest pain at PAT appointment.  Patient verbalized understanding and agreement to the Pre-Surgical Instructions that were given to them at this PAT appointment. Patient was also educated of the need to review these PAT instructions again prior to his/her surgery.I reviewed the appropriate phone numbers to call if they have any and questions or concerns.

## 2023-07-24 ENCOUNTER — Encounter (HOSPITAL_COMMUNITY)
Admission: RE | Admit: 2023-07-24 | Discharge: 2023-07-24 | Disposition: A | Discharge status: Home / Self Care | Payer: Medicare Other | Source: Ambulatory Visit | Attending: Orthopedic Surgery

## 2023-07-24 ENCOUNTER — Other Ambulatory Visit: Payer: Self-pay

## 2023-07-24 ENCOUNTER — Encounter (HOSPITAL_COMMUNITY): Payer: Self-pay

## 2023-07-24 VITALS — BP 159/91 | HR 70 | Temp 98.1°F | Resp 16 | Ht 69.5 in | Wt 213.8 lb

## 2023-07-24 DIAGNOSIS — Z8546 Personal history of malignant neoplasm of prostate: Secondary | ICD-10-CM | POA: Diagnosis not present

## 2023-07-24 DIAGNOSIS — N189 Chronic kidney disease, unspecified: Secondary | ICD-10-CM | POA: Diagnosis not present

## 2023-07-24 DIAGNOSIS — Z01812 Encounter for preprocedural laboratory examination: Secondary | ICD-10-CM | POA: Insufficient documentation

## 2023-07-24 DIAGNOSIS — Z79899 Other long term (current) drug therapy: Secondary | ICD-10-CM | POA: Insufficient documentation

## 2023-07-24 DIAGNOSIS — Z01818 Encounter for other preprocedural examination: Secondary | ICD-10-CM

## 2023-07-24 DIAGNOSIS — I10 Essential (primary) hypertension: Secondary | ICD-10-CM

## 2023-07-24 DIAGNOSIS — I129 Hypertensive chronic kidney disease with stage 1 through stage 4 chronic kidney disease, or unspecified chronic kidney disease: Secondary | ICD-10-CM | POA: Insufficient documentation

## 2023-07-24 DIAGNOSIS — M1711 Unilateral primary osteoarthritis, right knee: Secondary | ICD-10-CM | POA: Insufficient documentation

## 2023-07-24 DIAGNOSIS — Z8673 Personal history of transient ischemic attack (TIA), and cerebral infarction without residual deficits: Secondary | ICD-10-CM | POA: Insufficient documentation

## 2023-07-24 HISTORY — DX: Gastro-esophageal reflux disease without esophagitis: K21.9

## 2023-07-24 HISTORY — DX: Chronic kidney disease, unspecified: N18.9

## 2023-07-24 HISTORY — DX: Anxiety disorder, unspecified: F41.9

## 2023-07-24 LAB — BASIC METABOLIC PANEL
Anion gap: 7 (ref 5–15)
BUN: 14 mg/dL (ref 8–23)
CO2: 25 mmol/L (ref 22–32)
Calcium: 10.4 mg/dL — ABNORMAL HIGH (ref 8.9–10.3)
Chloride: 110 mmol/L (ref 98–111)
Creatinine, Ser: 0.8 mg/dL (ref 0.61–1.24)
GFR, Estimated: >60 mL/min (ref >60)
Glucose, Bld: 98 mg/dL (ref 70–99)
Potassium: 5.1 mmol/L (ref 3.5–5.1)
Sodium: 142 mmol/L (ref 135–145)

## 2023-07-24 LAB — CBC
HCT: 42.9 % (ref 39.0–52.0)
Hemoglobin: 13.4 g/dL (ref 13.0–17.0)
MCH: 27.5 pg (ref 26.0–34.0)
MCHC: 31.2 g/dL (ref 30.0–36.0)
MCV: 88.1 fL (ref 80.0–100.0)
Platelets: 274 10*3/uL (ref 150–400)
RBC: 4.87 MIL/uL (ref 4.22–5.81)
RDW: 13.9 % (ref 11.5–15.5)
WBC: 11.3 10*3/uL — ABNORMAL HIGH (ref 4.0–10.5)
nRBC: 0 % (ref 0.0–0.2)

## 2023-07-24 LAB — SURGICAL PCR SCREEN
MRSA, PCR: NEGATIVE
Staphylococcus aureus: NEGATIVE

## 2023-07-25 LAB — NO BLOOD PRODUCTS

## 2023-07-25 NOTE — Anesthesia Preprocedure Evaluation (Addendum)
Anesthesia Evaluation  Patient identified by MRN, date of birth, ID band Patient awake    Reviewed: Allergy & Precautions, H&P , NPO status , Patient's Chart, lab work & pertinent test results  Airway Mallampati: II  TM Distance: >3 FB Neck ROM: Full    Dental no notable dental hx. (+) Teeth Intact, Dental Advisory Given   Pulmonary neg pulmonary ROS   Pulmonary exam normal breath sounds clear to auscultation       Cardiovascular hypertension, Pt. on medications + CAD   Rhythm:Regular Rate:Normal     Neuro/Psych   Anxiety Depression    CVA, No Residual Symptoms    GI/Hepatic Neg liver ROS,GERD  Medicated,,  Endo/Other  negative endocrine ROS    Renal/GU Renal disease  negative genitourinary   Musculoskeletal  (+) Arthritis , Osteoarthritis,    Abdominal   Peds  (+) ATTENTION DEFICIT DISORDER WITHOUT HYPERACTIVITY Hematology negative hematology ROS (+)   Anesthesia Other Findings   Reproductive/Obstetrics negative OB ROS                             Anesthesia Physical Anesthesia Plan  ASA: 3  Anesthesia Plan: Spinal   Post-op Pain Management: Regional block* and Ofirmev IV (intra-op)*   Induction: Intravenous  PONV Risk Score and Plan: 2 and Ondansetron, Dexamethasone and Propofol infusion  Airway Management Planned: Natural Airway and Simple Face Mask  Additional Equipment:   Intra-op Plan:   Post-operative Plan:   Informed Consent: I have reviewed the patients History and Physical, chart, labs and discussed the procedure including the risks, benefits and alternatives for the proposed anesthesia with the patient or authorized representative who has indicated his/her understanding and acceptance.     Dental advisory given  Plan Discussed with: CRNA  Anesthesia Plan Comments: (See PAT note 07/24/2023)       Anesthesia Quick Evaluation

## 2023-07-25 NOTE — Progress Notes (Signed)
Anesthesia Chart Review   Case: [id number] Date/Time: 07/31/23 1015   Procedure: TOTAL KNEE ARTHROPLASTY (Right: Knee)   Anesthesia type: Choice   Pre-op diagnosis: right knee osteoarthritis   Location: [facility] ROOM 10 / [facility]   Surgeons: Ollen Gross, MD       DISCUSSION:82 y.o. never smoker with h/o HTN, Stroke, CKD, prostate cancer, right knee OA scheduled for above procedure 07/31/2023 with Dr. Ollen Gross.   Pt referred by PCP to cardiology for preoperative evaluation. {T seen by cardiology 06/09/2023. Per OV note, " Perioperative cardiovascular risk assessment No cardiovascular symptoms reported. Able to Achieve at least 4 METS during daily activities with no symptoms. Cardiovascular exam today is normal. EKG demonstrates left anterior fascicular block, sinus rhythm, this pattern has been present for 10 years. No further workup needed for this patient. In general, orthopedic surgery is considered to be intermediate risk for MACCE.  He is an acceptable risk to proceed with right knee arthroplasty."  VS: BP (!) 159/91   Pulse 70   Temp 36.7 C (Oral)   Resp 16   Ht 5' 9.5" (1.765 m)   Wt 97 kg   SpO2 98%   BMI 31.13 kg/m   PROVIDERS: Corrington, Kip A, MD is PCP    LABS: Labs reviewed: Acceptable for surgery. (all labs ordered are listed, but only abnormal results are displayed)  Labs Reviewed  BASIC METABOLIC PANEL - Abnormal; Notable for the following components:      Result Value   Calcium 10.4 (*)    All other components within normal limits  CBC - Abnormal; Notable for the following components:   WBC 11.3 (*)    All other components within normal limits  SURGICAL PCR SCREEN     IMAGES:   EKG:   CV:  Past Medical History:  Diagnosis Date   ADD (attention deficit disorder)    Allergy    seasonal   Ankle edema    right worse than left pt going to urgent care at pcp office to see about asap as of 01-08-2020   Anxiety    Arthritis    right knee    Cataract    Chronic kidney disease    hx of kidney inf as a child   Coronary artery disease    Depression    Diverticulosis    Dysphagia    can choke on liquids occasionally   Erectile dysfunction    Eyelid drooping disease    both eyes right worse than left   GERD (gastroesophageal reflux disease)    Hemorrhoids    History of dysphasia    Hyperlipidemia    Hypertension    Lipoma of colon    Prostate cancer (HCC) 2009   Had treatment in [place]/Proton therapy 39 treatments   Squamous cell carcinoma    hand   Stroke (HCC) 2013   affected left side/ weakness of left side, no residual , no  neurologist seen  currently   Urinary incontinence    wears pads   Urinary incontinence    wears depends per pt    Past Surgical History:  Procedure Laterality Date   COLONOSCOPY  fall 2020   exploratory surgery after cva  2013   EYE SURGERY  2010   [place]  due to cataract   hemorrhoidal banding  2000   internal   MENISCUS REPAIR  2015   right knee   PROSTATE SURGERY  2013/2014   in [place] proton radiation done  MEDICATIONS:  acetaminophen (TYLENOL) 650 MG CR tablet   amphetamine-dextroamphetamine (ADDERALL) 20 MG tablet   aspirin 325 MG tablet   atorvastatin (LIPITOR) 40 MG tablet   benazepril (LOTENSIN) 40 MG tablet   Chlorphen-Pseudoephed-APAP (THERAFLU FLU/COLD PO)   FIBER PO   fluticasone (FLONASE) 50 MCG/ACT nasal spray   hydrochlorothiazide (MICROZIDE) 12.5 MG capsule   omeprazole (PRILOSEC) 20 MG capsule   Polyethyl Glycol-Propyl Glycol (LUBRICATING EYE DROPS OP)   traZODone (DESYREL) 150 MG tablet   No current facility-administered medications for this encounter.    Jodell Cipro Ward, PA-C [facility] [phone]

## 2023-07-31 ENCOUNTER — Observation Stay (HOSPITAL_COMMUNITY)
Admission: RE | Admit: 2023-07-31 | Discharge: 2023-08-01 | Disposition: A | Discharge status: Home / Self Care | Payer: Medicare Other | Attending: Orthopedic Surgery | Admitting: Orthopedic Surgery

## 2023-07-31 ENCOUNTER — Ambulatory Visit (HOSPITAL_COMMUNITY): Payer: Medicare Other | Admitting: Certified Registered"

## 2023-07-31 ENCOUNTER — Other Ambulatory Visit: Payer: Self-pay

## 2023-07-31 ENCOUNTER — Ambulatory Visit (HOSPITAL_COMMUNITY): Payer: Medicare Other | Admitting: Physician Assistant

## 2023-07-31 ENCOUNTER — Encounter (HOSPITAL_COMMUNITY): Payer: Self-pay | Admitting: Orthopedic Surgery

## 2023-07-31 ENCOUNTER — Encounter (HOSPITAL_COMMUNITY)
Admission: RE | Disposition: A | Discharge status: Home / Self Care | Payer: Self-pay | Source: Home / Self Care | Attending: Orthopedic Surgery

## 2023-07-31 DIAGNOSIS — M1711 Unilateral primary osteoarthritis, right knee: Secondary | ICD-10-CM

## 2023-07-31 DIAGNOSIS — N189 Chronic kidney disease, unspecified: Secondary | ICD-10-CM | POA: Insufficient documentation

## 2023-07-31 DIAGNOSIS — Z8546 Personal history of malignant neoplasm of prostate: Secondary | ICD-10-CM | POA: Diagnosis not present

## 2023-07-31 DIAGNOSIS — I2510 Atherosclerotic heart disease of native coronary artery without angina pectoris: Secondary | ICD-10-CM | POA: Diagnosis not present

## 2023-07-31 DIAGNOSIS — E785 Hyperlipidemia, unspecified: Secondary | ICD-10-CM | POA: Diagnosis not present

## 2023-07-31 DIAGNOSIS — Z8673 Personal history of transient ischemic attack (TIA), and cerebral infarction without residual deficits: Secondary | ICD-10-CM | POA: Insufficient documentation

## 2023-07-31 DIAGNOSIS — I10 Essential (primary) hypertension: Secondary | ICD-10-CM

## 2023-07-31 DIAGNOSIS — Z79899 Other long term (current) drug therapy: Secondary | ICD-10-CM | POA: Insufficient documentation

## 2023-07-31 DIAGNOSIS — I129 Hypertensive chronic kidney disease with stage 1 through stage 4 chronic kidney disease, or unspecified chronic kidney disease: Secondary | ICD-10-CM | POA: Insufficient documentation

## 2023-07-31 DIAGNOSIS — Z85828 Personal history of other malignant neoplasm of skin: Secondary | ICD-10-CM | POA: Insufficient documentation

## 2023-07-31 DIAGNOSIS — M179 Osteoarthritis of knee, unspecified: Principal | ICD-10-CM | POA: Diagnosis present

## 2023-07-31 DIAGNOSIS — Z7982 Long term (current) use of aspirin: Secondary | ICD-10-CM | POA: Insufficient documentation

## 2023-07-31 DIAGNOSIS — Z01818 Encounter for other preprocedural examination: Secondary | ICD-10-CM

## 2023-07-31 HISTORY — PX: TOTAL KNEE ARTHROPLASTY: SHX125

## 2023-07-31 SURGERY — ARTHROPLASTY, KNEE, TOTAL
Anesthesia: Regional | Site: Knee | Laterality: Right

## 2023-07-31 MED ORDER — DIPHENHYDRAMINE HCL 12.5 MG/5ML PO ELIX
12.5000 mg | ORAL_SOLUTION | ORAL | Status: DC | PRN
Start: 2023-07-31 — End: 2023-08-01

## 2023-07-31 MED ORDER — CHLORHEXIDINE GLUCONATE 0.12 % MT SOLN: 15.0000 mL | Freq: Once | OROMUCOSAL | Status: DC

## 2023-07-31 MED ORDER — PHENOL 1.4 % MT LIQD: 1.0000 | OROMUCOSAL | Status: DC | PRN

## 2023-07-31 MED ORDER — 0.9 % SODIUM CHLORIDE (POUR BTL) OPTIME
TOPICAL | Status: DC | PRN
  Administered 2023-07-31: 1000 mL

## 2023-07-31 MED ORDER — SODIUM CHLORIDE (PF) 0.9 % IJ SOLN
INTRAMUSCULAR | Status: AC
  Filled 2023-07-31: qty 10

## 2023-07-31 MED ORDER — BUPIVACAINE IN DEXTROSE 0.75-8.25 % IT SOLN
INTRATHECAL | Status: DC | PRN
  Administered 2023-07-31: 1.6 mL via INTRATHECAL

## 2023-07-31 MED ORDER — ATORVASTATIN CALCIUM 40 MG PO TABS
40.0000 mg | ORAL_TABLET | Freq: Every day | ORAL | Status: DC
  Administered 2023-07-31 – 2023-08-01 (×2): 40 mg via ORAL
  Filled 2023-07-31 (×2): qty 1

## 2023-07-31 MED ORDER — SODIUM CHLORIDE (PF) 0.9 % IJ SOLN
INTRAMUSCULAR | Status: AC
  Filled 2023-07-31: qty 50

## 2023-07-31 MED ORDER — CEFAZOLIN SODIUM-DEXTROSE 2-4 GM/100ML-% IV SOLN
2.0000 g | Freq: Four times a day (QID) | INTRAVENOUS | Status: AC
  Administered 2023-07-31 (×2): 2 g via INTRAVENOUS
  Filled 2023-07-31 (×2): qty 100

## 2023-07-31 MED ORDER — HYDROCHLOROTHIAZIDE 12.5 MG PO CAPS: 25.0000 mg | ORAL_CAPSULE | Freq: Every day | ORAL | Status: DC

## 2023-07-31 MED ORDER — HYDROMORPHONE HCL 1 MG/ML IJ SOLN
INTRAMUSCULAR | Status: AC
  Filled 2023-07-31: qty 1

## 2023-07-31 MED ORDER — FLUTICASONE PROPIONATE 50 MCG/ACT NA SUSP: 2.0000 | Freq: Every day | NASAL | Status: DC | PRN

## 2023-07-31 MED ORDER — ONDANSETRON HCL 4 MG/2ML IJ SOLN
4.0000 mg | Freq: Four times a day (QID) | INTRAMUSCULAR | Status: DC | PRN
Start: 2023-07-31 — End: 2023-08-01

## 2023-07-31 MED ORDER — AMPHETAMINE-DEXTROAMPHETAMINE 10 MG PO TABS
20.0000 mg | ORAL_TABLET | Freq: Three times a day (TID) | ORAL | Status: DC
  Administered 2023-08-01 (×2): 20 mg via ORAL
  Filled 2023-07-31 (×3): qty 2

## 2023-07-31 MED ORDER — PHENYLEPHRINE HCL-NACL 20-0.9 MG/250ML-% IV SOLN
INTRAVENOUS | Status: DC | PRN
  Administered 2023-07-31: 10 ug/min via INTRAVENOUS

## 2023-07-31 MED ORDER — DEXAMETHASONE SODIUM PHOSPHATE 10 MG/ML IJ SOLN
10.0000 mg | Freq: Once | INTRAMUSCULAR | Status: AC
  Administered 2023-08-01: 10 mg via INTRAVENOUS
  Filled 2023-07-31: qty 1

## 2023-07-31 MED ORDER — ACETAMINOPHEN 10 MG/ML IV SOLN
1000.0000 mg | Freq: Four times a day (QID) | INTRAVENOUS | Status: DC
  Administered 2023-07-31: 1000 mg via INTRAVENOUS
  Filled 2023-07-31: qty 100

## 2023-07-31 MED ORDER — POLYETHYLENE GLYCOL 3350 17 G PO PACK
17.0000 g | PACK | Freq: Every day | ORAL | Status: DC | PRN
Start: 2023-07-31 — End: 2023-08-01

## 2023-07-31 MED ORDER — PROPOFOL 500 MG/50ML IV EMUL
INTRAVENOUS | Status: DC | PRN
  Administered 2023-07-31: 20 ug/kg/min via INTRAVENOUS

## 2023-07-31 MED ORDER — HYDROMORPHONE HCL 1 MG/ML IJ SOLN
0.5000 mg | INTRAMUSCULAR | Status: AC | PRN
  Administered 2023-07-31 (×2): 0.5 mg via INTRAVENOUS

## 2023-07-31 MED ORDER — SODIUM CHLORIDE 0.9 % IV SOLN: INTRAVENOUS | Status: DC

## 2023-07-31 MED ORDER — FENTANYL CITRATE (PF) 100 MCG/2ML IJ SOLN
INTRAMUSCULAR | Status: AC
  Filled 2023-07-31: qty 2

## 2023-07-31 MED ORDER — POVIDONE-IODINE 10 % EX SWAB: 2.0000 | Freq: Once | CUTANEOUS | Status: DC

## 2023-07-31 MED ORDER — ORAL CARE MOUTH RINSE: 15.0000 mL | Freq: Once | OROMUCOSAL | Status: DC

## 2023-07-31 MED ORDER — METHOCARBAMOL 500 MG PO TABS
500.0000 mg | ORAL_TABLET | Freq: Four times a day (QID) | ORAL | Status: DC | PRN
  Filled 2023-07-31: qty 1

## 2023-07-31 MED ORDER — PANTOPRAZOLE SODIUM 40 MG PO TBEC
40.0000 mg | DELAYED_RELEASE_TABLET | Freq: Every day | ORAL | Status: DC
  Administered 2023-08-01: 40 mg via ORAL
  Filled 2023-07-31: qty 1

## 2023-07-31 MED ORDER — GLYCOPYRROLATE PF 0.2 MG/ML IJ SOSY
PREFILLED_SYRINGE | INTRAMUSCULAR | Status: DC | PRN
  Administered 2023-07-31: .2 mg via INTRAVENOUS

## 2023-07-31 MED ORDER — AMPHETAMINE-DEXTROAMPHETAMINE 10 MG PO TABS: 20.0000 mg | ORAL_TABLET | Freq: Three times a day (TID) | ORAL | Status: DC

## 2023-07-31 MED ORDER — ACETAMINOPHEN 325 MG PO TABS
325.0000 mg | ORAL_TABLET | Freq: Four times a day (QID) | ORAL | Status: DC | PRN
Start: 2023-08-01 — End: 2023-08-01

## 2023-07-31 MED ORDER — ONDANSETRON HCL 4 MG/2ML IJ SOLN
INTRAMUSCULAR | Status: DC | PRN
  Administered 2023-07-31: 4 mg via INTRAVENOUS

## 2023-07-31 MED ORDER — FENTANYL CITRATE PF 50 MCG/ML IJ SOSY
50.0000 ug | PREFILLED_SYRINGE | INTRAMUSCULAR | Status: AC
  Administered 2023-07-31: 50 ug via INTRAVENOUS
  Filled 2023-07-31: qty 2

## 2023-07-31 MED ORDER — LACTATED RINGERS IV SOLN: INTRAVENOUS | Status: DC

## 2023-07-31 MED ORDER — OXYCODONE HCL 5 MG PO TABS
10.0000 mg | ORAL_TABLET | ORAL | Status: DC | PRN
Start: 2023-07-31 — End: 2023-08-01

## 2023-07-31 MED ORDER — DEXAMETHASONE SODIUM PHOSPHATE 10 MG/ML IJ SOLN
8.0000 mg | Freq: Once | INTRAMUSCULAR | Status: AC
  Administered 2023-07-31: 8 mg via INTRAVENOUS

## 2023-07-31 MED ORDER — METOCLOPRAMIDE HCL 5 MG PO TABS: 5.0000 mg | ORAL_TABLET | Freq: Three times a day (TID) | ORAL | Status: DC | PRN

## 2023-07-31 MED ORDER — MORPHINE SULFATE (PF) 2 MG/ML IV SOLN: 1.0000 mg | INTRAVENOUS | Status: DC | PRN

## 2023-07-31 MED ORDER — SODIUM CHLORIDE 0.9 % IR SOLN
Status: DC | PRN
  Administered 2023-07-31: 1000 mL

## 2023-07-31 MED ORDER — TRAMADOL HCL 50 MG PO TABS: 50.0000 mg | ORAL_TABLET | Freq: Four times a day (QID) | ORAL | Status: DC | PRN

## 2023-07-31 MED ORDER — FLEET ENEMA RE ENEM
1.0000 | ENEMA | Freq: Once | RECTAL | Status: DC | PRN
Start: 2023-07-31 — End: 2023-08-01

## 2023-07-31 MED ORDER — CEFAZOLIN SODIUM-DEXTROSE 2-4 GM/100ML-% IV SOLN
2.0000 g | INTRAVENOUS | Status: AC
  Administered 2023-07-31: 2 g via INTRAVENOUS
  Filled 2023-07-31: qty 100

## 2023-07-31 MED ORDER — MENTHOL 3 MG MT LOZG: 1.0000 | LOZENGE | OROMUCOSAL | Status: DC | PRN

## 2023-07-31 MED ORDER — BISACODYL 10 MG RE SUPP: 10.0000 mg | Freq: Every day | RECTAL | Status: DC | PRN

## 2023-07-31 MED ORDER — SODIUM CHLORIDE (PF) 0.9 % IJ SOLN
INTRAMUSCULAR | Status: DC | PRN
  Administered 2023-07-31: 80 mL

## 2023-07-31 MED ORDER — OXYCODONE HCL 5 MG PO TABS
5.0000 mg | ORAL_TABLET | ORAL | Status: DC | PRN
Start: 2023-07-31 — End: 2023-08-01
  Administered 2023-07-31 – 2023-08-01 (×2): 5 mg via ORAL
  Filled 2023-07-31: qty 2
  Filled 2023-07-31: qty 1

## 2023-07-31 MED ORDER — RIVAROXABAN 10 MG PO TABS
10.0000 mg | ORAL_TABLET | Freq: Every day | ORAL | Status: DC
  Administered 2023-08-01: 10 mg via ORAL
  Filled 2023-07-31: qty 1

## 2023-07-31 MED ORDER — HYDROMORPHONE HCL 1 MG/ML IJ SOLN
0.2500 mg | INTRAMUSCULAR | Status: DC | PRN
  Administered 2023-07-31: 0.25 mg via INTRAVENOUS
  Administered 2023-07-31 (×3): 0.5 mg via INTRAVENOUS
  Administered 2023-07-31: 0.25 mg via INTRAVENOUS

## 2023-07-31 MED ORDER — FENTANYL CITRATE (PF) 100 MCG/2ML IJ SOLN
INTRAMUSCULAR | Status: DC | PRN
  Administered 2023-07-31 (×2): 50 ug via INTRAVENOUS

## 2023-07-31 MED ORDER — ONDANSETRON HCL 4 MG PO TABS
4.0000 mg | ORAL_TABLET | Freq: Four times a day (QID) | ORAL | Status: DC | PRN
Start: 2023-07-31 — End: 2023-08-01

## 2023-07-31 MED ORDER — PROPOFOL 10 MG/ML IV BOLUS
INTRAVENOUS | Status: DC | PRN
  Administered 2023-07-31: 20 mg via INTRAVENOUS
  Administered 2023-07-31: 30 mg via INTRAVENOUS
  Administered 2023-07-31: 20 mg via INTRAVENOUS
  Administered 2023-07-31: 100 mg via INTRAVENOUS
  Administered 2023-07-31: 20 mg via INTRAVENOUS

## 2023-07-31 MED ORDER — BUPIVACAINE LIPOSOME 1.3 % IJ SUSP
INTRAMUSCULAR | Status: AC
  Filled 2023-07-31: qty 20

## 2023-07-31 MED ORDER — POLYVINYL ALCOHOL 1.4 % OP SOLN: Freq: Every day | OPHTHALMIC | Status: DC | PRN

## 2023-07-31 MED ORDER — DOCUSATE SODIUM 100 MG PO CAPS
100.0000 mg | ORAL_CAPSULE | Freq: Two times a day (BID) | ORAL | Status: DC
  Administered 2023-07-31 – 2023-08-01 (×3): 100 mg via ORAL
  Filled 2023-07-31 (×3): qty 1

## 2023-07-31 MED ORDER — METHOCARBAMOL 1000 MG/10ML IJ SOLN: 500.0000 mg | Freq: Four times a day (QID) | INTRAMUSCULAR | Status: DC | PRN

## 2023-07-31 MED ORDER — ACETAMINOPHEN 500 MG PO TABS
1000.0000 mg | ORAL_TABLET | Freq: Four times a day (QID) | ORAL | Status: AC
  Administered 2023-07-31 – 2023-08-01 (×4): 1000 mg via ORAL
  Filled 2023-07-31 (×4): qty 2

## 2023-07-31 MED ORDER — STERILE WATER FOR IRRIGATION IR SOLN
Status: DC | PRN
  Administered 2023-07-31: 1000 mL

## 2023-07-31 MED ORDER — BUPIVACAINE LIPOSOME 1.3 % IJ SUSP: 20.0000 mL | Freq: Once | INTRAMUSCULAR | Status: DC

## 2023-07-31 MED ORDER — METOCLOPRAMIDE HCL 5 MG/ML IJ SOLN: 5.0000 mg | Freq: Three times a day (TID) | INTRAMUSCULAR | Status: DC | PRN

## 2023-07-31 MED ORDER — TRANEXAMIC ACID-NACL 1000-0.7 MG/100ML-% IV SOLN
1000.0000 mg | INTRAVENOUS | Status: AC
  Administered 2023-07-31: 1000 mg via INTRAVENOUS
  Filled 2023-07-31: qty 100

## 2023-07-31 MED ORDER — BUPIVACAINE-EPINEPHRINE (PF) 0.5% -1:200000 IJ SOLN
INTRAMUSCULAR | Status: DC | PRN
  Administered 2023-07-31: 20 mL via PERINEURAL

## 2023-07-31 SURGICAL SUPPLY — 45 items
ATTUNE MED DOME PAT 38 KNEE (Knees) IMPLANT
ATTUNE PS FEM RT SZ 8 CEM KNEE (Femur) IMPLANT
ATTUNE PSRP INSR SZ8 7 KNEE (Insert) IMPLANT
BAG COUNTER SPONGE SURGICOUNT (BAG) IMPLANT
BAG ZIPLOCK 12X15 (MISCELLANEOUS) ×2 IMPLANT
BASE TIBIAL ROT PLAT SZ 7 KNEE (Knees) IMPLANT
BLADE SAG 18X100X1.27 (BLADE) ×2 IMPLANT
BLADE SAW SGTL 11.0X1.19X90.0M (BLADE) ×2 IMPLANT
BNDG ELASTIC 6INX 5YD STR LF (GAUZE/BANDAGES/DRESSINGS) ×2 IMPLANT
BNDG ELASTIC 6X10 VLCR STRL LF (GAUZE/BANDAGES/DRESSINGS) IMPLANT
BOWL SMART MIX CTS (DISPOSABLE) ×2 IMPLANT
CEMENT HV SMART SET (Cement) ×4 IMPLANT
COVER SURGICAL LIGHT HANDLE (MISCELLANEOUS) ×2 IMPLANT
CUFF TRNQT CYL 34X4.125X (TOURNIQUET CUFF) ×2 IMPLANT
DERMABOND ADVANCED .7 DNX12 (GAUZE/BANDAGES/DRESSINGS) ×2 IMPLANT
DRAPE U-SHAPE 47X51 STRL (DRAPES) ×2 IMPLANT
DRSG AQUACEL AG ADV 3.5X10 (GAUZE/BANDAGES/DRESSINGS) ×2 IMPLANT
DURAPREP 26ML APPLICATOR (WOUND CARE) ×2 IMPLANT
ELECT REM PT RETURN 15FT ADLT (MISCELLANEOUS) ×2 IMPLANT
GLOVE BIO SURGEON STRL SZ 6.5 (GLOVE) IMPLANT
GLOVE BIO SURGEON STRL SZ7 (GLOVE) IMPLANT
GLOVE BIO SURGEON STRL SZ8 (GLOVE) ×2 IMPLANT
GLOVE BIOGEL PI IND STRL 7.0 (GLOVE) IMPLANT
GLOVE BIOGEL PI IND STRL 8 (GLOVE) ×2 IMPLANT
GOWN STRL REUS W/ TWL LRG LVL3 (GOWN DISPOSABLE) ×2 IMPLANT
HOLDER FOLEY CATH W/STRAP (MISCELLANEOUS) IMPLANT
IMMOBILIZER KNEE 20 (SOFTGOODS) ×1 IMPLANT
IMMOBILIZER KNEE 20 THIGH 36 (SOFTGOODS) ×2 IMPLANT
KIT TURNOVER KIT A (KITS) IMPLANT
MANIFOLD NEPTUNE II (INSTRUMENTS) ×2 IMPLANT
NS IRRIG 1000ML POUR BTL (IV SOLUTION) ×2 IMPLANT
PACK TOTAL KNEE CUSTOM (KITS) ×2 IMPLANT
PADDING CAST COTTON 6X4 STRL (CAST SUPPLIES) ×4 IMPLANT
PIN STEINMAN FIXATION KNEE (PIN) IMPLANT
PROTECTOR NERVE ULNAR (MISCELLANEOUS) ×2 IMPLANT
SET HNDPC FAN SPRY TIP SCT (DISPOSABLE) ×2 IMPLANT
SUT MNCRL AB 4-0 PS2 18 (SUTURE) ×2 IMPLANT
SUT STRATAFIX 0 PDS 27 VIOLET (SUTURE) ×1 IMPLANT
SUT VIC AB 2-0 CT1 TAPERPNT 27 (SUTURE) ×6 IMPLANT
SUTURE STRATFX 0 PDS 27 VIOLET (SUTURE) ×2 IMPLANT
TIBIAL BASE ROT PLAT SZ 7 KNEE (Knees) ×1 IMPLANT
TRAY FOLEY MTR SLVR 16FR STAT (SET/KITS/TRAYS/PACK) IMPLANT
TUBE SUCTION HIGH CAP CLEAR NV (SUCTIONS) ×2 IMPLANT
WATER STERILE IRR 1000ML POUR (IV SOLUTION) ×4 IMPLANT
WRAP KNEE MAXI GEL POST OP (GAUZE/BANDAGES/DRESSINGS) ×2 IMPLANT

## 2023-07-31 NOTE — Plan of Care (Signed)
  Problem: Education: Goal: Knowledge of General Education information will improve Description: Including pain rating scale, medication(s)/side effects and non-pharmacologic comfort measures Outcome: Progressing   Problem: Health Behavior/Discharge Planning: Goal: Ability to manage health-related needs will improve Outcome: Progressing   Problem: Clinical Measurements: Goal: Ability to maintain clinical measurements within normal limits will improve Outcome: Progressing Goal: Will remain free from infection Outcome: Progressing Goal: Diagnostic test results will improve Outcome: Progressing Goal: Respiratory complications will improve Outcome: Progressing Goal: Cardiovascular complication will be avoided Outcome: Progressing   Problem: Activity: Goal: Risk for activity intolerance will decrease Outcome: Adequate for Discharge   Problem: Nutrition: Goal: Adequate nutrition will be maintained Outcome: Completed/Met   Problem: Coping: Goal: Level of anxiety will decrease Outcome: Progressing   Problem: Elimination: Goal: Will not experience complications related to bowel motility Outcome: Progressing Goal: Will not experience complications related to urinary retention Outcome: Progressing   Problem: Pain Managment: Goal: General experience of comfort will improve and/or be controlled Outcome: Progressing   Problem: Safety: Goal: Ability to remain free from injury will improve Outcome: Progressing   Problem: Skin Integrity: Goal: Risk for impaired skin integrity will decrease Outcome: Progressing   Problem: Education: Goal: Knowledge of the prescribed therapeutic regimen will improve Outcome: Progressing Goal: Individualized Educational Video(s) Outcome: Completed/Met   Problem: Activity: Goal: Ability to avoid complications of mobility impairment will improve Outcome: Progressing Goal: Range of joint motion will improve Outcome: Adequate for Discharge    Problem: Clinical Measurements: Goal: Postoperative complications will be avoided or minimized Outcome: Progressing   Problem: Pain Management: Goal: Pain level will decrease with appropriate interventions Outcome: Progressing   Problem: Skin Integrity: Goal: Will show signs of wound healing Outcome: Progressing

## 2023-07-31 NOTE — Anesthesia Procedure Notes (Signed)
Procedure Name: LMA Insertion Date/Time: 07/31/2023 10:46 AM  Performed by: Ponciano Ort, CRNAPre-anesthesia Checklist: Patient identified, Emergency Drugs available, Suction available and Patient being monitored Patient Re-evaluated:Patient Re-evaluated prior to induction Oxygen Delivery Method: Circle system utilized Preoxygenation: Pre-oxygenation with 100% oxygen Induction Type: IV induction Ventilation: Mask ventilation without difficulty LMA: LMA inserted LMA Size: 5.0 Number of attempts: 1 Placement Confirmation: positive ETCO2 and breath sounds checked- equal and bilateral Tube secured with: Tape Dental Injury: Teeth and Oropharynx as per pre-operative assessment

## 2023-07-31 NOTE — Progress Notes (Signed)
Orthopedic Tech Progress Note Patient Details:  Gary Parrish March 05, 1941 [id number]  CPM Right Knee CPM Right Knee: Off Right Knee Flexion (Degrees): 40 Right Knee Extension (Degrees): 10  Post Interventions Patient Tolerated: Well  Tonye Pearson 07/31/2023, 4:08 PM

## 2023-07-31 NOTE — Anesthesia Procedure Notes (Signed)
Anesthesia Regional Block: Adductor canal block   Pre-Anesthetic Checklist: , timeout performed,  Correct Patient, Correct Site, Correct Laterality,  Correct Procedure, Correct Position, site marked,  Risks and benefits discussed,  Pre-op evaluation,  At surgeon's request and post-op pain management  Laterality: Right  Prep: Maximum Sterile Barrier Precautions used, chloraprep       Needles:  Injection technique: Single-shot  Needle Type: Echogenic Stimulator Needle     Needle Length: 9cm  Needle Gauge: 21     Additional Needles:   Procedures:,,,, ultrasound used (permanent image in chart),,    Narrative:  Start time: 07/31/2023 9:16 AM End time: 07/31/2023 9:26 AM Injection made incrementally with aspirations every 5 mL.  Performed by: Personally  Anesthesiologist: Gaynelle Adu, MD

## 2023-07-31 NOTE — Transfer of Care (Signed)
Immediate Anesthesia Transfer of Care Note  Patient: Gary Parrish  Procedure(s) Performed: TOTAL KNEE ARTHROPLASTY (Right: Knee)  Patient Location: PACU  Anesthesia Type:Spinal and GA combined with regional for post-op pain  Level of Consciousness: awake, alert , oriented, and patient cooperative  Airway & Oxygen Therapy: Patient Spontanous Breathing and Patient connected to face mask oxygen  Post-op Assessment: Report given to RN and Post -op Vital signs reviewed and stable  Post vital signs: Reviewed and stable  Last Vitals:  Vitals Value Taken Time  BP 138/84 07/31/23 1200  Temp    Pulse 67 07/31/23 1202  Resp 21 07/31/23 1202  SpO2 99 % 07/31/23 1202  Vitals shown include unfiled device data.  Last Pain:  Vitals:   07/31/23 0930  TempSrc:   PainSc: 0-No pain         Complications: No notable events documented.

## 2023-07-31 NOTE — Anesthesia Postprocedure Evaluation (Signed)
Anesthesia Post Note  Patient: Gary Parrish  Procedure(s) Performed: TOTAL KNEE ARTHROPLASTY (Right: Knee)     Patient location during evaluation: PACU Anesthesia Type: Regional and General Level of consciousness: awake and alert Pain management: pain level controlled Vital Signs Assessment: post-procedure vital signs reviewed and stable Respiratory status: spontaneous breathing, nonlabored ventilation, respiratory function stable and patient connected to nasal cannula oxygen Cardiovascular status: blood pressure returned to baseline and stable Postop Assessment: no apparent nausea or vomiting Anesthetic complications: no  No notable events documented.  Last Vitals:  Vitals:   07/31/23 1345 07/31/23 1416  BP: (!) 149/90 (!) 145/92  Pulse: 65 68  Resp: 18 14  Temp:  (!) 36.4 C  SpO2: 97% 96%    Last Pain:  Vitals:   07/31/23 1328  TempSrc:   PainSc: 5                  Gunnison Chahal,W. EDMOND

## 2023-07-31 NOTE — Anesthesia Procedure Notes (Signed)
Spinal  Patient location during procedure: OR Start time: 07/31/2023 10:13 AM End time: 07/31/2023 10:18 AM Reason for block: surgical anesthesia Staffing Performed: anesthesiologist  Anesthesiologist: Gaynelle Adu, MD Performed by: Gaynelle Adu, MD Authorized by: Gaynelle Adu, MD   Preanesthetic Checklist Completed: patient identified, IV checked, risks and benefits discussed, surgical consent, monitors and equipment checked, pre-op evaluation and timeout performed Spinal Block Patient position: sitting Prep: DuraPrep Patient monitoring: cardiac monitor, continuous pulse ox and blood pressure Approach: midline Location: L3-4 Injection technique: single-shot Needle Needle type: Quincke (Attempted Pencan 24G. Unable to locate space.)  Needle gauge: 22 G Needle length: 9 cm Assessment Sensory level: T8 Events: CSF return Additional Notes Functioning IV was confirmed and monitors were applied. Sterile prep and drape, including hand hygiene and sterile gloves were used. The patient was positioned and the spine was prepped. The skin was anesthetized with lidocaine.  Free flow of clear CSF was obtained prior to injecting local anesthetic into the CSF.  The spinal needle aspirated freely following injection.  The needle was carefully withdrawn.  The patient tolerated the procedure well.

## 2023-07-31 NOTE — Op Note (Signed)
OPERATIVE REPORT-TOTAL KNEE ARTHROPLASTY   Pre-operative diagnosis- Osteoarthritis  Right knee(s)  Post-operative diagnosis- Osteoarthritis Right knee(s)  Procedure-  Right  Total Knee Arthroplasty  Surgeon- Gus Rankin. Deaira Leckey, MD  Assistant- Arther Abbott, PA-C   Anesthesia-   Adductor canal block and general  EBL- 25 ml   Drains None  Tourniquet time-  Total Tourniquet Time Documented: Thigh (Right) - 40 minutes Total: Thigh (Right) - 40 minutes     Complications- None  Condition-PACU - hemodynamically stable.   Brief Clinical Note  Gary Parrish is a 83 y.o. year old male with end stage OA of his right knee with progressively worsening pain and dysfunction. He has constant pain, with activity and at rest and significant functional deficits with difficulties even with ADLs. He has had extensive non-op management including analgesics, injections of cortisone and viscosupplements, and home exercise program, but remains in significant pain with significant dysfunction. Radiographs show bone on bone arthritis medial and patellofemoral. He presents now for right Total Knee Arthroplasty.     Procedure in detail---   The patient is brought into the operating room and positioned supine on the operating table. After successful administration of  Adductor canal block and general,   a tourniquet is placed high on the  Right thigh(s) and the lower extremity is prepped and draped in the usual sterile fashion. Time out is performed by the operating team and then the  Right lower extremity is wrapped in Esmarch, knee flexed and the tourniquet inflated to 300 mmHg.       A midline incision is made with a ten blade through the subcutaneous tissue to the level of the extensor mechanism. A fresh blade is used to make a medial parapatellar arthrotomy. Soft tissue over the proximal medial tibia is subperiosteally elevated to the joint line with a knife and into the semimembranosus bursa with a Cobb  elevator. Soft tissue over the proximal lateral tibia is elevated with attention being paid to avoiding the patellar tendon on the tibial tubercle. The patella is everted, knee flexed 90 degrees and the ACL and PCL are removed. Findings are bone on bone medial and patellofemoral with large global osteophytes        The drill is used to create a starting hole in the distal femur and the canal is thoroughly irrigated with sterile saline to remove the fatty contents. The 5 degree Right  valgus alignment guide is placed into the femoral canal and the distal femoral cutting block is pinned to remove 9 mm off the distal femur. Resection is made with an oscillating saw.      The tibia is subluxed forward and the menisci are removed. The extramedullary alignment guide is placed referencing proximally at the medial aspect of the tibial tubercle and distally along the second metatarsal axis and tibial crest. The block is pinned to remove 2mm off the more deficient medial  side. Resection is made with an oscillating saw. Size 7is the most appropriate size for the tibia and the proximal tibia is prepared with the modular drill and keel punch for that size.      The femoral sizing guide is placed and size 8 is most appropriate. Rotation is marked off the epicondylar axis and confirmed by creating a rectangular flexion gap at 90 degrees. The size 8 cutting block is pinned in this rotation and the anterior, posterior and chamfer cuts are made with the oscillating saw. The intercondylar block is then placed and that cut  is made.      Trial size 7 tibial component, trial size 8 posterior stabilized femur and a 7  mm posterior stabilized rotating platform insert trial is placed. Full extension is achieved with excellent varus/valgus and anterior/posterior balance throughout full range of motion. The patella is everted and thickness measured to be 24  mm. Free hand resection is taken to 14 mm, a 38 template is placed, lug holes  are drilled, trial patella is placed, and it tracks normally. Osteophytes are removed off the posterior femur with the trial in place. All trials are removed and the cut bone surfaces prepared with pulsatile lavage. Cement is mixed and once ready for implantation, the size 7 tibial implant, size  8 posterior stabilized femoral component, and the size 38 patella are cemented in place and the patella is held with the clamp. The trial insert is placed and the knee held in full extension. The Exparel (20 ml mixed with 60 ml saline) is injected into the extensor mechanism, posterior capsule, medial and lateral gutters and subcutaneous tissues.  All extruded cement is removed and once the cement is hard the permanent 7 mm posterior stabilized rotating platform insert is placed into the tibial tray.      The wound is copiously irrigated with saline solution and the extensor mechanism closed with # 0 Stratofix suture. The tourniquet is released for a total tourniquet time of 40  minutes. Flexion against gravity is 140 degrees and the patella tracks normally. Subcutaneous tissue is closed with 2.0 vicryl and subcuticular with running 4.0 Monocryl. The incision is cleaned and dried and steri-strips and a bulky sterile dressing are applied. The limb is placed into a knee immobilizer and the patient is awakened and transported to recovery in stable condition.      Please note that a surgical assistant was a medical necessity for this procedure in order to perform it in a safe and expeditious manner. Surgical assistant was necessary to retract the ligaments and vital neurovascular structures to prevent injury to them and also necessary for proper positioning of the limb to allow for anatomic placement of the prosthesis.   Gus Rankin Vinisha Faxon, MD    07/31/2023, 11:33 AM

## 2023-07-31 NOTE — Interval H&P Note (Signed)
History and Physical Interval Note:  07/31/2023 8:24 AM  Gary Parrish  has presented today for surgery, with the diagnosis of right knee osteoarthritis.  The various methods of treatment have been discussed with the patient and family. After consideration of risks, benefits and other options for treatment, the patient has consented to  Procedure(s): TOTAL KNEE ARTHROPLASTY (Right) as a surgical intervention.  The patient's history has been reviewed, patient examined, no change in status, stable for surgery.  I have reviewed the patient's chart and labs.  Questions were answered to the patient's satisfaction.     Homero Fellers Abdallah Hern

## 2023-07-31 NOTE — Evaluation (Signed)
Physical Therapy Evaluation Patient Details Name: Gary Parrish MRN: [id number] DOB: July 29, 1940 Today's Date: 07/31/2023  History of Present Illness  83 yo male s/p R TKA on 07/31/23. PMH; CVA, HTN, CAD, LE edema  Clinical Impression  Pt is s/p TKA resulting in the deficits listed below (see PT Problem List).  Pt agreeable to PT, amb ~ 5' with RW and min to mod assist. Limited by dizziness, resolved once seated in recliner, pain controlled at 2/10 after PT.  BP just prior to PT session was 135/102. Pt son and caregiver present for session. Caregiver reports they have a 3in1 at home   Pt will benefit from acute skilled PT to increase their independence and safety with mobility to allow discharge.          If plan is discharge home, recommend the following: A little help with walking and/or transfers;A little help with bathing/dressing/bathroom;Assist for transportation;Assistance with cooking/housework;Help with stairs or ramp for entrance   Can travel by private vehicle        Equipment Recommendations Rolling walker (2 wheels)  Recommendations for Other Services       Functional Status Assessment Patient has had a recent decline in their functional status and demonstrates the ability to make significant improvements in function in a reasonable and predictable amount of time.     Precautions / Restrictions Precautions Precautions: Fall;Knee Required Braces or Orthoses: Knee Immobilizer - Right Knee Immobilizer - Right: Discontinue once straight leg raise with < 10 degree lag Restrictions Weight Bearing Restrictions Per Provider Order: No Other Position/Activity Restrictions: WBAT      Mobility  Bed Mobility Overal bed mobility: Needs Assistance Bed Mobility: Supine to Sit     Supine to sit: Min assist     General bed mobility comments: assist with RLE, incr time and effort    Transfers Overall transfer level: Needs assistance Equipment used: Rolling walker (2  wheels) Transfers: Sit to/from Stand Sit to Stand: Mod assist           General transfer comment: assist to rise and stabiilze. cues for proper hand placement    Ambulation/Gait Ambulation/Gait assistance: Min assist, Mod assist Gait Distance (Feet): 5 Feet Assistive device: Rolling walker (2 wheels) Gait Pattern/deviations: Step-to pattern, Decreased weight shift to right       General Gait Details: cues for sequence, breathing and posture  Stairs            Wheelchair Mobility     Tilt Bed    Modified Rankin (Stroke Patients Only)       Balance Overall balance assessment: Needs assistance Sitting-balance support: No upper extremity supported, Single extremity supported, Feet supported Sitting balance-Leahy Scale: Fair     Standing balance support: Reliant on assistive device for balance, During functional activity Standing balance-Leahy Scale: Poor                               Pertinent Vitals/Pain Pain Assessment Pain Assessment: 0-10 Pain Score: 2  Pain Location: right knee Pain Descriptors / Indicators: Aching, Sore Pain Intervention(s): Limited activity within patient's tolerance, Monitored during session, Premedicated before session    Home Living Family/patient expects to be discharged to:: Private residence Living Arrangements: Alone Available Help at Discharge: Other (Comment) (caregiver) Type of Home: House Home Access: Stairs to enter   Entrance Stairs-Number of Steps: 2   Home Layout: Two level;Able to live on main level with bedroom/bathroom Home  Equipment: None;BSC/3in1      Prior Function Prior Level of Function : Independent/Modified Independent                     Extremity/Trunk Assessment   Upper Extremity Assessment Upper Extremity Assessment: Overall WFL for tasks assessed    Lower Extremity Assessment Lower Extremity Assessment: RLE deficits/detail RLE Deficits / Details: ankle WFL, knee  extension 2+/5, ~ 14 degree quad lag       Communication   Communication Communication: No apparent difficulties  Cognition Arousal: Alert Behavior During Therapy: WFL for tasks assessed/performed Overall Cognitive Status: Impaired/Different from baseline Area of Impairment: Following commands, Problem solving                       Following Commands: Follows one step commands with increased time     Problem Solving: Slow processing, Decreased initiation, Requires verbal cues          General Comments      Exercises Total Joint Exercises Ankle Circles/Pumps: AROM, Both, 5 reps   Assessment/Plan    PT Assessment Patient needs continued PT services  PT Problem List Decreased strength;Decreased range of motion;Decreased activity tolerance;Decreased balance;Decreased mobility;Decreased knowledge of use of DME;Pain       PT Treatment Interventions DME instruction;Gait training;Stair training;Functional mobility training;Therapeutic activities;Patient/family education;Therapeutic exercise    PT Goals (Current goals can be found in the Care Plan section)  Acute Rehab PT Goals PT Goal Formulation: With patient Time For Goal Achievement: 08/07/23 Potential to Achieve Goals: Good    Frequency 7X/week     Co-evaluation               AM-PAC PT "6 Clicks" Mobility  Outcome Measure Help needed turning from your back to your side while in a flat bed without using bedrails?: A Little Help needed moving from lying on your back to sitting on the side of a flat bed without using bedrails?: A Little Help needed moving to and from a bed to a chair (including a wheelchair)?: A Little Help needed standing up from a chair using your arms (e.g., wheelchair or bedside chair)?: A Lot Help needed to walk in hospital room?: A Lot Help needed climbing 3-5 steps with a railing? : Total 6 Click Score: 14    End of Session Equipment Utilized During Treatment: Gait belt;Right  knee immobilizer Activity Tolerance: Patient tolerated treatment well Patient left: with call bell/phone within reach;in chair;with chair alarm set;with family/visitor present Nurse Communication: Mobility status PT Visit Diagnosis: Other abnormalities of gait and mobility (R26.89);History of falling (Z91.81)    Time: 6213-0865 PT Time Calculation (min) (ACUTE ONLY): 25 min   Charges:   PT Evaluation $PT Eval Low Complexity: 1 Low PT Treatments $Therapeutic Activity: 8-22 mins PT General Charges $$ ACUTE PT VISIT: 1 Visit         Ivyana Locey, PT  Acute Rehab Dept [facility]) [phone]  07/31/2023   [facility] 07/31/2023, 4:57 PM

## 2023-07-31 NOTE — Progress Notes (Signed)
Orthopedic Tech Progress Note Patient Details:  Gary Parrish [birth year] [id number]  CPM Right Knee CPM Right Knee: On Right Knee Flexion (Degrees): 40 Right Knee Extension (Degrees): 10  Post Interventions Patient Tolerated: Well  Darleen Crocker 07/31/2023, 12:14 PM

## 2023-07-31 NOTE — Discharge Instructions (Addendum)
Gary Gross, MD Total Joint Specialist [facility] [place] [address], [address] [place], [place] [address] [phone]  TOTAL KNEE REPLACEMENT POSTOPERATIVE DIRECTIONS    Knee Rehabilitation, Guidelines Following Surgery  Results after knee surgery are often greatly improved when you follow the exercise, range of motion and muscle strengthening exercises prescribed by your doctor. Safety measures are also important to protect the knee from further injury. If any of these exercises cause you to have increased pain or swelling in your knee joint, decrease the amount until you are comfortable again and slowly increase them. If you have problems or questions, call your caregiver or physical therapist for advice.   BLOOD CLOT PREVENTION Take a 10 mg Xarelto once a day for three weeks following surgery. Then resume your regular dose of 325mg  Aspirin once daily. You may resume your vitamins/supplements once you have discontinued the Xarelto. Do not take any NSAIDs (Advil, Aleve, Ibuprofen, Meloxicam, etc.) until you have discontinued the Xarelto.   HOME CARE INSTRUCTIONS  Remove items at home which could result in a fall. This includes throw rugs or furniture in walking pathways.  ICE to the affected knee as much as tolerated. Icing helps control swelling. If the swelling is well controlled you will be more comfortable and rehab easier. Continue to use ice on the knee for pain and swelling from surgery. You may notice swelling that will progress down to the foot and ankle. This is normal after surgery. Elevate the leg when you are not up walking on it.    Continue to use the breathing machine which will help keep your temperature down. It is common for your temperature to cycle up and down following surgery, especially at night when you are not up moving around and exerting yourself. The breathing machine keeps your lungs expanded and your temperature down. Do not place pillow  under the operative knee, focus on keeping the knee straight while resting  DIET You may resume your previous home diet once you are discharged from the hospital.  DRESSING / WOUND CARE / SHOWERING Keep your bulky bandage on for 2 days. On the third post-operative day you may remove the Ace bandage and gauze. There is a waterproof adhesive bandage on your skin which will stay in place until your first follow-up appointment. Once you remove this you will not need to place another bandage You may begin showering 3 days following surgery, but do not submerge the incision under water.  ACTIVITY For the first 5 days, the key is rest and control of pain and swelling Do your home exercises twice a day starting on post-operative day 3. On the days you go to physical therapy, just do the home exercises once that day. You should rest, ice and elevate the leg for 50 minutes out of every hour. Get up and walk/stretch for 10 minutes per hour. After 5 days you can increase your activity slowly as tolerated. Walk with your walker as instructed. Use the walker until you are comfortable transitioning to a cane. Walk with the cane in the opposite hand of the operative leg. You may discontinue the cane once you are comfortable and walking steadily. Avoid periods of inactivity such as sitting longer than an hour when not asleep. This helps prevent blood clots.  You may discontinue the knee immobilizer once you are able to perform a straight leg raise while lying down. You may resume a sexual relationship in one month or when given the OK by  your doctor.  You may return to work once you are cleared by your doctor.  Do not drive a car for 6 weeks or until released by your surgeon.  Do not drive while taking narcotics.  TED HOSE STOCKINGS Wear the elastic stockings on both legs for three weeks following surgery during the day. You may remove them at night for sleeping.  WEIGHT BEARING Weight bearing as tolerated  with assist device (walker, cane, etc) as directed, use it as long as suggested by your surgeon or therapist, typically at least 4-6 weeks.  POSTOPERATIVE CONSTIPATION PROTOCOL Constipation - defined medically as fewer than three stools per week and severe constipation as less than one stool per week.  One of the most common issues patients have following surgery is constipation.  Even if you have a regular bowel pattern at home, your normal regimen is likely to be disrupted due to multiple reasons following surgery.  Combination of anesthesia, postoperative narcotics, change in appetite and fluid intake all can affect your bowels.  In order to avoid complications following surgery, here are some recommendations in order to help you during your recovery period.  Colace (docusate) - Pick up an over-the-counter form of Colace or another stool softener and take twice a day as long as you are requiring postoperative pain medications.  Take with a full glass of water daily.  If you experience loose stools or diarrhea, hold the colace until you stool forms back up. If your symptoms do not get better within 1 week or if they get worse, check with your doctor. Dulcolax (bisacodyl) - Pick up over-the-counter and take as directed by the product packaging as needed to assist with the movement of your bowels.  Take with a full glass of water.  Use this product as needed if not relieved by Colace only.  MiraLax (polyethylene glycol) - Pick up over-the-counter to have on hand. MiraLax is a solution that will increase the amount of water in your bowels to assist with bowel movements.  Take as directed and can mix with a glass of water, juice, soda, coffee, or tea. Take if you go more than two days without a movement. Do not use MiraLax more than once per day. Call your doctor if you are still constipated or irregular after using this medication for 7 days in a row.  If you continue to have problems with postoperative  constipation, please contact the office for further assistance and recommendations.  If you experience "the worst abdominal pain ever" or develop nausea or vomiting, please contact the office immediatly for further recommendations for treatment.  ITCHING If you experience itching with your medications, try taking only a single pain pill, or even half a pain pill at a time.  You can also use Benadryl over the counter for itching or also to help with sleep.   MEDICATIONS See your medication summary on the "After Visit Summary" that the nursing staff will review with you prior to discharge.  You may have some home medications which will be placed on hold until you complete the course of blood thinner medication.  It is important for you to complete the blood thinner medication as prescribed by your surgeon.  Continue your approved medications as instructed at time of discharge.  PRECAUTIONS If you experience chest pain or shortness of breath - call 911 immediately for transfer to the hospital emergency department.  If you develop a fever greater that 101 F, purulent drainage from wound, increased redness  or drainage from wound, foul odor from the wound/dressing, or calf pain - CONTACT YOUR SURGEON.                                                   FOLLOW-UP APPOINTMENTS Make sure you keep all of your appointments after your operation with your surgeon and caregivers. You should call the office at the above phone number and make an appointment for approximately two weeks after the date of your surgery or on the date instructed by your surgeon outlined in the "After Visit Summary".  RANGE OF MOTION AND STRENGTHENING EXERCISES  Rehabilitation of the knee is important following a knee injury or an operation. After just a few days of immobilization, the muscles of the thigh which control the knee become weakened and shrink (atrophy). Knee exercises are designed to build up the tone and strength of the thigh  muscles and to improve knee motion. Often times heat used for twenty to thirty minutes before working out will loosen up your tissues and help with improving the range of motion but do not use heat for the first two weeks following surgery. These exercises can be done on a training (exercise) mat, on the floor, on a table or on a bed. Use what ever works the best and is most comfortable for you Knee exercises include:  Leg Lifts - While your knee is still immobilized in a splint or cast, you can do straight leg raises. Lift the leg to 60 degrees, hold for 3 sec, and slowly lower the leg. Repeat 10-20 times 2-3 times daily. Perform this exercise against resistance later as your knee gets better.  Quad and Hamstring Sets - Tighten up the muscle on the front of the thigh (Quad) and hold for 5-10 sec. Repeat this 10-20 times hourly. Hamstring sets are done by pushing the foot backward against an object and holding for 5-10 sec. Repeat as with quad sets.  Leg Slides: Lying on your back, slowly slide your foot toward your buttocks, bending your knee up off the floor (only go as far as is comfortable). Then slowly slide your foot back down until your leg is flat on the floor again. Angel Wings: Lying on your back spread your legs to the side as far apart as you can without causing discomfort.  A rehabilitation program following serious knee injuries can speed recovery and prevent re-injury in the future due to weakened muscles. Contact your doctor or a physical therapist for more information on knee rehabilitation.   POST-OPERATIVE OPIOID TAPER INSTRUCTIONS: It is important to wean off of your opioid medication as soon as possible. If you do not need pain medication after your surgery it is ok to stop day one. Opioids include: Codeine, Hydrocodone(Norco, Vicodin), Oxycodone(Percocet, oxycontin) and hydromorphone amongst others.  Long term and even short term use of opiods can cause: Increased pain  response Dependence Constipation Depression Respiratory depression And more.  Withdrawal symptoms can include Flu like symptoms Nausea, vomiting And more Techniques to manage these symptoms Hydrate well Eat regular healthy meals Stay active Use relaxation techniques(deep breathing, meditating, yoga) Do Not substitute Alcohol to help with tapering If you have been on opioids for less than two weeks and do not have pain than it is ok to stop all together.  Plan to wean off of opioids This  plan should start within one week post op of your joint replacement. Maintain the same interval or time between taking each dose and first decrease the dose.  Cut the total daily intake of opioids by one tablet each day Next start to increase the time between doses. The last dose that should be eliminated is the evening dose.   IF YOU ARE TRANSFERRED TO A SKILLED REHAB FACILITY If the patient is transferred to a skilled rehab facility following release from the hospital, a list of the current medications will be sent to the facility for the patient to continue.  When discharged from the skilled rehab facility, please have the facility set up the patient's Home Health Physical Therapy prior to being released. Also, the skilled facility will be responsible for providing the patient with their medications at time of release from the facility to include their pain medication, the muscle relaxants, and their blood thinner medication. If the patient is still at the rehab facility at time of the two week follow up appointment, the skilled rehab facility will also need to assist the patient in arranging follow up appointment in our office and any transportation needs.  MAKE SURE YOU:  Understand these instructions.  Get help right away if you are not doing well or get worse.   DENTAL ANTIBIOTICS:  In most cases prophylactic antibiotics for Dental procdeures after total joint surgery are not  necessary.  Exceptions are as follows:  1. History of prior total joint infection  2. Severely immunocompromised (Organ Transplant, cancer chemotherapy, Rheumatoid biologic meds such as Humera)  3. Poorly controlled diabetes (A1C &gt; 8.0, blood glucose over 200)  If you have one of these conditions, contact your surgeon for an antibiotic prescription, prior to your dental procedure.    Pick up stool softner and laxative for home use following surgery while on pain medications. Do not submerge incision under water. Please use good hand washing techniques while changing dressing each day. May shower starting three days after surgery. Please use a clean towel to pat the incision dry following showers. Continue to use ice for pain and swelling after surgery. Do not use any lotions or creams on the incision until instructed by your surgeon.  Information on my medicine - XARELTO (Rivaroxaban)  Why was Xarelto prescribed for you? Xarelto was prescribed for you to reduce the risk of blood clots forming after orthopedic surgery. The medical term for these abnormal blood clots is venous thromboembolism (VTE).  What do you need to know about xarelto ? Take your Xarelto ONCE DAILY at the same time every day. You may take it either with or without food.  If you have difficulty swallowing the tablet whole, you may crush it and mix in applesauce just prior to taking your dose.  Take Xarelto exactly as prescribed by your doctor and DO NOT stop taking Xarelto without talking to the doctor who prescribed the medication.  Stopping without other VTE prevention medication to take the place of Xarelto may increase your risk of developing a clot.  After discharge, you should have regular check-up appointments with your healthcare provider that is prescribing your Xarelto.    What do you do if you miss a dose? If you miss a dose, take it as soon as you remember on the same day then continue  your regularly scheduled once daily regimen the next day. Do not take two doses of Xarelto on the same day.   Important Safety Information A possible side  effect of Xarelto is bleeding. You should call your healthcare provider right away if you experience any of the following: Bleeding from an injury or your nose that does not stop. Unusual colored urine (red or dark brown) or unusual colored stools (red or black). Unusual bruising for unknown reasons. A serious fall or if you hit your head (even if there is no bleeding).  Some medicines may interact with Xarelto and might increase your risk of bleeding while on Xarelto. To help avoid this, consult your healthcare provider or pharmacist prior to using any new prescription or non-prescription medications, including herbals, vitamins, non-steroidal anti-inflammatory drugs (NSAIDs) and supplements.  This website has more information on Xarelto: VisitDestination.com.br.

## 2023-08-01 ENCOUNTER — Other Ambulatory Visit (HOSPITAL_COMMUNITY): Payer: Self-pay

## 2023-08-01 ENCOUNTER — Encounter (HOSPITAL_COMMUNITY): Payer: Self-pay | Admitting: Orthopedic Surgery

## 2023-08-01 DIAGNOSIS — M1711 Unilateral primary osteoarthritis, right knee: Secondary | ICD-10-CM | POA: Diagnosis not present

## 2023-08-01 LAB — BASIC METABOLIC PANEL
Anion gap: 10 (ref 5–15)
BUN: 17 mg/dL (ref 8–23)
CO2: 23 mmol/L (ref 22–32)
Calcium: 9.7 mg/dL (ref 8.9–10.3)
Chloride: 104 mmol/L (ref 98–111)
Creatinine, Ser: 0.9 mg/dL (ref 0.61–1.24)
GFR, Estimated: >60 mL/min (ref >60)
Glucose, Bld: 145 mg/dL — ABNORMAL HIGH (ref 70–99)
Potassium: 4.3 mmol/L (ref 3.5–5.1)
Sodium: 137 mmol/L (ref 135–145)

## 2023-08-01 LAB — CBC
HCT: 35.2 % — ABNORMAL LOW (ref 39.0–52.0)
Hemoglobin: 11 g/dL — ABNORMAL LOW (ref 13.0–17.0)
MCH: 27.4 pg (ref 26.0–34.0)
MCHC: 31.3 g/dL (ref 30.0–36.0)
MCV: 87.8 fL (ref 80.0–100.0)
Platelets: 227 10*3/uL (ref 150–400)
RBC: 4.01 MIL/uL — ABNORMAL LOW (ref 4.22–5.81)
RDW: 14.3 % (ref 11.5–15.5)
WBC: 14.1 10*3/uL — ABNORMAL HIGH (ref 4.0–10.5)
nRBC: 0 % (ref 0.0–0.2)

## 2023-08-01 MED ORDER — RIVAROXABAN 10 MG PO TABS
10.0000 mg | ORAL_TABLET | Freq: Every day | ORAL | 0 refills | Status: AC
  Filled 2023-08-01: qty 20, 20d supply, fill #0

## 2023-08-01 MED ORDER — TRAMADOL HCL 50 MG PO TABS
50.0000 mg | ORAL_TABLET | Freq: Four times a day (QID) | ORAL | 0 refills | Status: AC | PRN
  Filled 2023-08-01: qty 40, 5d supply, fill #0

## 2023-08-01 MED ORDER — METHOCARBAMOL 500 MG PO TABS
500.0000 mg | ORAL_TABLET | Freq: Four times a day (QID) | ORAL | 0 refills | Status: AC | PRN
  Filled 2023-08-01: qty 40, 10d supply, fill #0

## 2023-08-01 MED ORDER — RIVAROXABAN 10 MG PO TABS
10.0000 mg | ORAL_TABLET | Freq: Every day | ORAL | 0 refills | Status: DC
  Filled 2023-08-01: qty 20, 20d supply, fill #0

## 2023-08-01 MED ORDER — OXYCODONE HCL 5 MG PO TABS
5.0000 mg | ORAL_TABLET | Freq: Four times a day (QID) | ORAL | 0 refills | Status: AC | PRN
  Filled 2023-08-01: qty 42, 6d supply, fill #0

## 2023-08-01 MED ORDER — ONDANSETRON HCL 4 MG PO TABS
4.0000 mg | ORAL_TABLET | Freq: Four times a day (QID) | ORAL | 0 refills | Status: AC | PRN
  Filled 2023-08-01: qty 20, 5d supply, fill #0

## 2023-08-01 NOTE — Plan of Care (Signed)
Patient discharged home with family via private vehicle. AVS and discharge instructions reviewed. Patient and family verbalize understanding. Haydee Salter, RN 08/01/23 3:17 PM

## 2023-08-01 NOTE — TOC Transition Note (Signed)
 Transition of Care [facility]) - Discharge Note   Patient Details  Name: Gary Parrish MRN: [id number] Date of Birth: [birth year]  [facility] CM/SW Contact:  NORMAN ASPEN, LCSW Phone Number: 08/01/2023, 10:43 AM   Clinical Narrative:    Met with pt and son and confirming he has received RW to room via [employer].  OPPT already arranged with [facility] PT.  No further TOC needs.   Final next level of care: OP Rehab Barriers to Discharge: No Barriers Identified   Patient Goals and CMS Choice Patient states their goals for this hospitalization and ongoing recovery are:: return home          Discharge Placement                       Discharge Plan and Services Additional resources added to the After Visit Summary for                  DME Arranged: Walker rolling DME Agency: [employer]                  Social Drivers of Health (SDOH) Interventions SDOH Screenings   Food Insecurity: No Food Insecurity (07/31/2023)  Housing: High Risk (07/31/2023)  Transportation Needs: No Transportation Needs (07/31/2023)  Utilities: Not At Risk (07/31/2023)  Financial Resource Strain: Low Risk  (07/24/2023)   Received from [facility]  Physical Activity: Unknown (06/01/2023)   Received from [facility]  Social Connections: Moderately Isolated (07/31/2023)  Stress: No Stress Concern Present (06/01/2023)   Received from [facility]  Tobacco Use: Low Risk  (07/31/2023)     Readmission Risk Interventions     No data to display

## 2023-08-01 NOTE — Progress Notes (Signed)
 Physical Therapy Treatment Patient Details Name: Gary Parrish MRN: [id number] DOB: 09-Sep-1940 Today's Date: 08/01/2023   History of Present Illness 83 yo male s/p R TKA on 07/31/23. PMH; CVA, HTN, CAD, LE edema    PT Comments  Pt progressing well, pt will likely be ready to d/c  after pm session.     If plan is discharge home, recommend the following: A little help with walking and/or transfers;A little help with bathing/dressing/bathroom;Assist for transportation;Assistance with cooking/housework;Help with stairs or ramp for entrance   Can travel by private vehicle        Equipment Recommendations  Rolling walker (2 wheels)    Recommendations for Other Services       Precautions / Restrictions Precautions Precautions: Fall;Knee Required Braces or Orthoses: Knee Immobilizer - Right Knee Immobilizer - Right: Discontinue once straight leg raise with < 10 degree lag Restrictions Weight Bearing Restrictions Per Provider Order: No Other Position/Activity Restrictions: WBAT     Mobility  Bed Mobility Overal bed mobility: Needs Assistance Bed Mobility: Supine to Sit     Supine to sit: HOB elevated, Contact guard     General bed mobility comments: incr time and effort, able to complete with CGA for safety    Transfers Overall transfer level: Needs assistance Equipment used: Rolling walker (2 wheels) Transfers: Sit to/from Stand Sit to Stand: Min assist, Contact guard assist           General transfer comment: assist to rise and stabiilze. cues for proper hand placement    Ambulation/Gait Ambulation/Gait assistance: Contact guard assist, Min assist Gait Distance (Feet): 70 Feet Assistive device: Rolling walker (2 wheels) Gait Pattern/deviations: Step-to pattern, Step-through pattern       General Gait Details: progression to step through pattern, CGA for safety. good progression   Stairs             Wheelchair Mobility     Tilt Bed    Modified  Rankin (Stroke Patients Only)       Balance     Sitting balance-Leahy Scale: Fair     Standing balance support: Reliant on assistive device for balance, During functional activity Standing balance-Leahy Scale: Poor                              Cognition Arousal: Alert Behavior During Therapy: WFL for tasks assessed/performed   Area of Impairment: Following commands, Problem solving                       Following Commands: Follows one step commands with increased time     Problem Solving: Slow processing, Decreased initiation, Requires verbal cues          Exercises Total Joint Exercises Ankle Circles/Pumps: AROM, 10 reps, Both Quad Sets: AROM, 10 reps, Both Heel Slides: AAROM, 10 reps, Right Straight Leg Raises: Strengthening, 10 reps, AROM, Right    General Comments        Pertinent Vitals/Pain Pain Assessment Pain Assessment: 0-10 Pain Score: 1  Pain Location: right knee Pain Descriptors / Indicators: Aching, Sore Pain Intervention(s): Limited activity within patient's tolerance, Monitored during session, Premedicated before session, Repositioned, Ice applied    Home Living                          Prior Function            PT Goals (  current goals can now be found in the care plan section) Acute Rehab PT Goals PT Goal Formulation: With patient Time For Goal Achievement: 08/07/23 Potential to Achieve Goals: Good Progress towards PT goals: Progressing toward goals    Frequency    7X/week      PT Plan      Co-evaluation              AM-PAC PT 6 Clicks Mobility   Outcome Measure  Help needed turning from your back to your side while in a flat bed without using bedrails?: A Little Help needed moving from lying on your back to sitting on the side of a flat bed without using bedrails?: A Little Help needed moving to and from a bed to a chair (including a wheelchair)?: A Little Help needed standing up  from a chair using your arms (e.g., wheelchair or bedside chair)?: A Little Help needed to walk in hospital room?: A Little Help needed climbing 3-5 steps with a railing? : A Little 6 Click Score: 18    End of Session Equipment Utilized During Treatment: Gait belt;Right knee immobilizer Activity Tolerance: Patient tolerated treatment well Patient left: with call bell/phone within reach;in chair;with chair alarm set;with family/visitor present Nurse Communication: Mobility status PT Visit Diagnosis: Other abnormalities of gait and mobility (R26.89);History of falling (Z91.81)     Time: 8994-8970 PT Time Calculation (min) (ACUTE ONLY): 24 min  Charges:    $Gait Training: 8-22 mins $Therapeutic Exercise: 8-22 mins PT General Charges $$ ACUTE PT VISIT: 1 Visit                     Markan Cazarez, PT  Acute Rehab Dept [facility]) [phone]  08/01/2023    [facility] 08/01/2023, 10:44 AM

## 2023-08-01 NOTE — Care Management Obs Status (Signed)
MEDICARE OBSERVATION STATUS NOTIFICATION   Patient Details  Name: Gary Parrish MRN: [id number] Date of Birth: 25-Nov-1940   Medicare Observation Status Notification Given:  Yes  Pt refused to sign.  Madisyn Mawhinney, LCSW 08/01/2023, 10:40 AM

## 2023-08-01 NOTE — Progress Notes (Signed)
 Physical Therapy Treatment Patient Details Name: Gary Parrish MRN: [id number] DOB: [birth year] Today's Date: 08/01/2023   History of Present Illness 83 yo male s/p R TKA on 07/31/23. PMH; CVA, HTN, CAD, LE edema    PT Comments   Pt progressing well, meeting goals. Should be ready for d/c after pm session. Son present this morning.   If plan is discharge home, recommend the following: A little help with walking and/or transfers;A little help with bathing/dressing/bathroom;Assist for transportation;Assistance with cooking/housework;Help with stairs or ramp for entrance   Can travel by private vehicle        Equipment Recommendations  Rolling walker (2 wheels)    Recommendations for Other Services       Precautions / Restrictions Precautions Precautions: Fall;Knee Required Braces or Orthoses: Knee Immobilizer - Right Knee Immobilizer - Right: Discontinue once straight leg raise with < 10 degree lag Restrictions Weight Bearing Restrictions Per Provider Order: No Other Position/Activity Restrictions: WBAT     Mobility  Bed Mobility Overal bed mobility: Needs Assistance Bed Mobility: Supine to Sit     Supine to sit: HOB elevated, Contact guard     General bed mobility comments: incr time and effort, able to complete with CGA for safety    Transfers Overall transfer level: Needs assistance Equipment used: Rolling walker (2 wheels) Transfers: Sit to/from Stand Sit to Stand: Min assist, Contact guard assist           General transfer comment: assist to rise and stabiilze. cues for proper hand placement    Ambulation/Gait Ambulation/Gait assistance: Contact guard assist, Min assist Gait Distance (Feet): 70 Feet Assistive device: Rolling walker (2 wheels) Gait Pattern/deviations: Step-to pattern, Step-through pattern       General Gait Details: progression to step through pattern, CGA for safety. good progression   Stairs             Wheelchair Mobility      Tilt Bed    Modified Rankin (Stroke Patients Only)       Balance     Sitting balance-Leahy Scale: Fair     Standing balance support: Reliant on assistive device for balance, During functional activity Standing balance-Leahy Scale: Poor                              Cognition Arousal: Alert Behavior During Therapy: WFL for tasks assessed/performed   Area of Impairment: Following commands, Problem solving                       Following Commands: Follows one step commands with increased time     Problem Solving: Slow processing, Decreased initiation, Requires verbal cues          Exercises Total Joint Exercises Ankle Circles/Pumps: AROM, 10 reps, Both Quad Sets: AROM, 10 reps, Both Heel Slides: AAROM, 10 reps, Right Straight Leg Raises: Strengthening, 10 reps, AROM, Right    General Comments        Pertinent Vitals/Pain Pain Assessment Pain Assessment: 0-10 Pain Score: 1  Pain Location: right knee Pain Descriptors / Indicators: Aching, Sore Pain Intervention(s): Limited activity within patient's tolerance, Monitored during session, Premedicated before session, Repositioned, Ice applied    Home Living                          Prior Function  PT Goals (current goals can now be found in the care plan section) Acute Rehab PT Goals PT Goal Formulation: With patient Time For Goal Achievement: 08/07/23 Potential to Achieve Goals: Good Progress towards PT goals: Progressing toward goals    Frequency    7X/week      PT Plan      Co-evaluation              AM-PAC PT 6 Clicks Mobility   Outcome Measure  Help needed turning from your back to your side while in a flat bed without using bedrails?: A Little Help needed moving from lying on your back to sitting on the side of a flat bed without using bedrails?: A Little Help needed moving to and from a bed to a chair (including a wheelchair)?: A  Little Help needed standing up from a chair using your arms (e.g., wheelchair or bedside chair)?: A Little Help needed to walk in hospital room?: A Little Help needed climbing 3-5 steps with a railing? : A Little 6 Click Score: 18    End of Session Equipment Utilized During Treatment: Gait belt;Right knee immobilizer Activity Tolerance: Patient tolerated treatment well Patient left: with call bell/phone within reach;in chair;with chair alarm set;with family/visitor present Nurse Communication: Mobility status PT Visit Diagnosis: Other abnormalities of gait and mobility (R26.89);History of falling (Z91.81)     Time: 8994-8970 PT Time Calculation (min) (ACUTE ONLY): 24 min  Charges:    $Gait Training: 8-22 mins $Therapeutic Exercise: 8-22 mins PT General Charges $$ ACUTE PT VISIT: 1 Visit                     Pastor Sgro, PT  Acute Rehab Dept [facility]) [phone]  08/01/2023    [facility] 08/01/2023, 11:32 AM

## 2023-08-01 NOTE — Progress Notes (Signed)
 Subjective: 1 Day Post-Op Procedure(s) (LRB): TOTAL KNEE ARTHROPLASTY (Right) Patient seen in rounds by Dr. Melodi. Patient is well, and has had no acute complaints or problems. Denies SOB or chest pain. Denies dizziness or lightheadedness. Denies calf pain. Foley cath removed this AM. Patient reports pain as moderate. Worked with physical therapy yesterday and ambulated 5'. Was limited by dizziness. We will continue physical therapy today.  Objective: Vital signs in last 24 hours: Temp:  [97.4 F (36.3 C)-98.9 F (37.2 C)] 98.3 F (36.8 C) (02/04 0518) Pulse Rate:  [63-88] 70 (02/04 0518) Resp:  [12-24] 17 (02/04 0518) BP: (123-164)/(72-102) 134/74 (02/04 0518) SpO2:  [94 %-98 %] 96 % (02/04 0518) Weight:  [97 kg] 97 kg (02/03 0834)  Intake/Output from previous day:  Intake/Output Summary (Last 24 hours) at 08/01/2023 0756 Last data filed at 08/01/2023 0600 Gross per 24 hour  Intake 2612.23 ml  Output 840 ml  Net 1772.23 ml     Intake/Output this shift: No intake/output data recorded.  Labs: Recent Labs    08/01/23 0332  HGB 11.0*   Recent Labs    08/01/23 0332  WBC 14.1*  RBC 4.01*  HCT 35.2*  PLT 227   Recent Labs    08/01/23 0332  NA 137  K 4.3  CL 104  CO2 23  BUN 17  CREATININE 0.90  GLUCOSE 145*  CALCIUM  9.7   No results for input(s): LABPT, INR in the last 72 hours.  Exam: General - Patient is Alert and Oriented Extremity - Neurologically intact Neurovascular intact Sensation intact distally Dorsiflexion/Plantar flexion intact Dressing - dressing C/D/I Motor Function - intact, moving foot and toes well on exam.  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   GERD (gastroesophageal reflux disease)    Hemorrhoids    History of dysphasia    Hyperlipidemia    Hypertension    Lipoma of colon    Prostate cancer (HCC) 2009   Had treatment in [place] /Proton therapy 39 treatments   Squamous cell carcinoma    hand   Stroke (HCC) 2013   affected left side/ weakness of left side, no residual , no  neurologist seen  currently   Urinary incontinence    wears pads   Urinary incontinence    wears depends per pt    Assessment/Plan: 1 Day Post-Op Procedure(s) (LRB): TOTAL KNEE ARTHROPLASTY (Right) Principal Problem:   OA (osteoarthritis) of knee Active Problems:   Primary osteoarthritis of right knee  Estimated body mass index is 31.13 kg/m as calculated from the following:   Height as of this encounter: 5' 9.5 (1.765 m).   Weight as of this encounter: 97 kg. Advance diet Up with therapy D/C IV fluids   Patient's anticipated LOS is less than 2 midnights, meeting these requirements: - Lives within 1 hour of  care - Has a competent adult at home to recover with post-op - NO history of  - Chronic pain requiring opiods  - Diabetes  - Coronary Artery Disease  - Heart failure  - Heart attack  - DVT/VTE  - Cardiac arrhythmia  - Respiratory Failure/COPD  - Renal failure  - Anemia  - Advanced Liver disease  DVT Prophylaxis - Xarelto  Weight bearing as tolerated.  Continue physical therapy. Possible discharge this afternoon pending progress with physical therapy and if meeting patient goals. Low threshold for patient to spend additional night in hospital to maximize mobility and for pain control.  R. Zelda Kobs, PA-C Orthopedic Surgery 08/01/2023, 7:56 AM

## 2023-08-01 NOTE — Progress Notes (Signed)
 PT TX NOTE  08/01/23 1200  PT Visit Information  Last PT Received On 08/01/23  Assistance Needed Pt progressing well, meeting goals. Caregiver present and able to provide needed assist.  History of Present Illness 83 yo male s/p R TKA on 07/31/23. PMH; CVA, HTN, CAD, LE edema  Precautions  Precautions Fall;Knee  Required Braces or Orthoses Knee Immobilizer - Right  Knee Immobilizer - Right Discontinue once straight leg raise with < 10 degree lag  Restrictions  Weight Bearing Restrictions Per Provider Order No  Other Position/Activity Restrictions WBAT  Pain Assessment  Pain Assessment 0-10  Pain Score 1  Pain Location right knee  Pain Descriptors / Indicators Discomfort  Pain Intervention(s) Limited activity within patient's tolerance;Monitored during session;Premedicated before session;Repositioned;Ice applied  Cognition  Arousal Alert  Behavior During Therapy WFL for tasks assessed/performed  Area of Impairment Problem solving;Memory;Following commands  Memory Decreased short-term memory  Following Commands Follows one step commands with increased time  Problem Solving Slow processing;Decreased initiation;Requires verbal cues  Transfers  Overall transfer level Needs assistance  Equipment used Rolling walker (2 wheels)  Transfers Sit to/from Stand  Sit to Stand Contact guard assist  General transfer comment repeated x2 for instruction. CGA  to rise and stabiilze. cues for proper hand placement  Ambulation/Gait  Ambulation/Gait assistance Contact guard assist  Gait Distance (Feet) 120 Feet  Assistive device Rolling walker (2 wheels)  Gait Pattern/deviations Step-to pattern;Step-through pattern  General Gait Details progression to step through pattern, CGA for safety. improved wt shift to RLE; no buckling  Stairs Yes  Stairs assistance Min assist  Stair Management Forwards;With walker;Step to pattern  Number of Stairs 3  General stair comments cues for technique and sequence.  posterior LOB x1 with min assist to recover; caregiver present and able to provide needed assist  Balance  Sitting balance-Leahy Scale Fair  Standing balance support Reliant on assistive device for balance;During functional activity  Standing balance-Leahy Scale Poor  PT - End of Session  Equipment Utilized During Treatment Gait belt;Right knee immobilizer  Activity Tolerance Patient tolerated treatment well  Patient left with call bell/phone within reach;in chair;with chair alarm set;with family/visitor present  Nurse Communication Mobility status   PT - Assessment/Plan  PT Visit Diagnosis Other abnormalities of gait and mobility (R26.89);History of falling (Z91.81)  PT Frequency (ACUTE ONLY) 7X/week  Follow Up Recommendations Follow physician's recommendations for discharge plan and follow up therapies  Patient can return home with the following A little help with walking and/or transfers;A little help with bathing/dressing/bathroom;Assist for transportation;Assistance with cooking/housework;Help with stairs or ramp for entrance  PT equipment Rolling walker (2 wheels)  AM-PAC PT 6 Clicks Mobility Outcome Measure (Version 2)  Help needed turning from your back to your side while in a flat bed without using bedrails? 3  Help needed moving from lying on your back to sitting on the side of a flat bed without using bedrails? 3  Help needed moving to and from a bed to a chair (including a wheelchair)? 3  Help needed standing up from a chair using your arms (e.g., wheelchair or bedside chair)? 3  Help needed to walk in hospital room? 3  Help needed climbing 3-5 steps with a railing?  3  6 Click Score 18  Consider Recommendation of Discharge To: [facility] with [facility]  Progressive Mobility  What is the highest level of mobility based on the progressive mobility assessment? Level 5 (Walks with assist in room/hall) - Balance while stepping forward/back and can  walk in room with assist - Complete  PT Goal  Progression  Progress towards PT goals Progressing toward goals  Acute Rehab PT Goals  PT Goal Formulation With patient  Time For Goal Achievement 08/07/23  Potential to Achieve Goals Good  PT Time Calculation  PT Start Time (ACUTE ONLY) 1256  PT Stop Time (ACUTE ONLY) 1312  PT Time Calculation (min) (ACUTE ONLY) 16 min  PT General Charges  $$ ACUTE PT VISIT 1 Visit  PT Treatments  $Gait Training 8-22 mins

## 2023-08-02 NOTE — Discharge Summary (Signed)
 Physician Discharge Summary   Patient ID: Gary Parrish MRN: [id number] DOB/AGE: [birth year] 83 y.o.  Admit date: 07/31/2023 Discharge date: 08/01/2023  Primary Diagnosis: Osteoarthritis right knee    Admission Diagnoses:  Past Medical History:  Diagnosis Date   ADD (attention deficit disorder)    Allergy    seasonal   Ankle edema    right worse than left pt going to urgent care at pcp office to see about asap as of 01-08-2020   Anxiety    Arthritis    right knee   Cataract    Chronic kidney disease    hx of kidney inf as a child   Coronary artery disease    Depression    Diverticulosis    Dysphagia    can choke on liquids occasionally   Erectile dysfunction    Eyelid drooping disease    both eyes right worse than left   GERD (gastroesophageal reflux disease)    Hemorrhoids    History of dysphasia    Hyperlipidemia    Hypertension    Lipoma of colon    Prostate cancer (HCC) 2009   Had treatment in [place] /Proton therapy 39 treatments   Squamous cell carcinoma    hand   Stroke [facility]) 2013   affected left side/ weakness of left side, no residual , no  neurologist seen  currently   Urinary incontinence    wears pads   Urinary incontinence    wears depends per pt   Discharge Diagnoses:   Principal Problem:   OA (osteoarthritis) of knee Active Problems:   Primary osteoarthritis of right knee  Estimated body mass index is 31.13 kg/m as calculated from the following:   Height as of this encounter: 5' 9.5 (1.765 m).   Weight as of this encounter: 97 kg.  Procedure:  Procedure(s) (LRB): TOTAL KNEE ARTHROPLASTY (Right)   Consults: None  HPI: Gary Parrish is a 83 y.o. year old male with end stage OA of his right knee with progressively worsening pain and dysfunction. He has constant pain, with activity and at rest and significant functional deficits with difficulties even with ADLs. He has had extensive non-op management including analgesics, injections of cortisone and  viscosupplements, and home exercise program, but remains in significant pain with significant dysfunction. Radiographs show bone on bone arthritis medial and patellofemoral. He presents now for right Total Knee Arthroplasty.   Laboratory Data: Admission on 07/31/2023, Discharged on 08/01/2023  Component Date Value Ref Range Status   Transfuse no blood products 07/24/2023    Final                   Value:TRANSFUSE NO BLOOD PRODUCTS, VERIFIED BY Verneita Marina RN, 01.27.25 AT 1348 Performed at [facility], [address] [place], [place] [address]    WBC 08/01/2023 14.1 (H)  4.0 - 10.5 K/uL Final   RBC 08/01/2023 4.01 (L)  4.22 - 5.81 MIL/uL Final   Hemoglobin 08/01/2023 11.0 (L)  13.0 - 17.0 g/dL Final   HCT 97/95/7974 35.2 (L)  39.0 - 52.0 % Final   MCV 08/01/2023 87.8  80.0 - 100.0 fL Final   MCH 08/01/2023 27.4  26.0 - 34.0 pg Final   MCHC 08/01/2023 31.3  30.0 - 36.0 g/dL Final   RDW 97/95/7974 14.3  11.5 - 15.5 % Final   Platelets 08/01/2023 227  150 - 400 K/uL Final   nRBC 08/01/2023 0.0  0.0 - 0.2 % Final   Performed at  [facility], [address], [place], [place] [address]   Sodium 08/01/2023 137  135 - 145 mmol/L Final   Potassium 08/01/2023 4.3  3.5 - 5.1 mmol/L Final   Chloride 08/01/2023 104  98 - 111 mmol/L Final   CO2 08/01/2023 23  22 - 32 mmol/L Final   Glucose, Bld 08/01/2023 145 (H)  70 - 99 mg/dL Final   Glucose reference range applies only to samples taken after fasting for at least 8 hours.   BUN 08/01/2023 17  8 - 23 mg/dL Final   Creatinine, Ser 08/01/2023 0.90  0.61 - 1.24 mg/dL Final   Calcium  08/01/2023 9.7  8.9 - 10.3 mg/dL Final   GFR, Estimated 08/01/2023 >60  >60 mL/min Final   Comment: (NOTE) Calculated using the CKD-EPI Creatinine Equation (2021)    Anion gap 08/01/2023 10  5 - 15 Final   Performed at [facility], [address] [place], [place] [address]  Hospital Outpatient Visit on  07/24/2023  Component Date Value Ref Range Status   MRSA, PCR 07/24/2023 NEGATIVE  NEGATIVE Final   Staphylococcus aureus 07/24/2023 NEGATIVE  NEGATIVE Final   Comment: (NOTE) The Xpert SA Assay (FDA approved for NASAL specimens in patients 49 years of age and older), is one component of a comprehensive surveillance program. It is not intended to diagnose infection nor to guide or monitor treatment. Performed at [facility], [address] [place], [place] [address]    Sodium 07/24/2023 142  135 - 145 mmol/L Final   Potassium 07/24/2023 5.1  3.5 - 5.1 mmol/L Final   Chloride 07/24/2023 110  98 - 111 mmol/L Final   CO2 07/24/2023 25  22 - 32 mmol/L Final   Glucose, Bld 07/24/2023 98  70 - 99 mg/dL Final   Glucose reference range applies only to samples taken after fasting for at least 8 hours.   BUN 07/24/2023 14  8 - 23 mg/dL Final   Creatinine, Ser 07/24/2023 0.80  0.61 - 1.24 mg/dL Final   Calcium  07/24/2023 10.4 (H)  8.9 - 10.3 mg/dL Final   GFR, Estimated 07/24/2023 >60  >60 mL/min Final   Comment: (NOTE) Calculated using the CKD-EPI Creatinine Equation (2021)    Anion gap 07/24/2023 7  5 - 15 Final   Performed at [facility], [address] [place], [place] [address]   WBC 07/24/2023 11.3 (H)  4.0 - 10.5 K/uL Final   RBC 07/24/2023 4.87  4.22 - 5.81 MIL/uL Final   Hemoglobin 07/24/2023 13.4  13.0 - 17.0 g/dL Final   HCT 98/72/7974 42.9  39.0 - 52.0 % Final   MCV 07/24/2023 88.1  80.0 - 100.0 fL Final   MCH 07/24/2023 27.5  26.0 - 34.0 pg Final   MCHC 07/24/2023 31.2  30.0 - 36.0 g/dL Final   RDW 98/72/7974 13.9  11.5 - 15.5 % Final   Platelets 07/24/2023 274  150 - 400 K/uL Final   nRBC 07/24/2023 0.0  0.0 - 0.2 % Final   Performed at [facility], [address], [place], [place] [address]     X-Rays:No results found.  EKG: Orders placed or performed during the hospital encounter of 06/01/23   EKG 12 lead  per protocol   EKG 12 lead per protocol     Hospital Course: Gary Parrish is a 83 y.o. who was admitted to [facility]. They were brought to the operating room on 07/31/2023  and underwent Procedure(s): TOTAL KNEE ARTHROPLASTY.  Patient tolerated the procedure well and was later transferred to the recovery room and then to the orthopaedic floor for postoperative care. They were given PO and IV analgesics for pain control following their surgery. They were given 24 hours of postoperative antibiotics of  Anti-infectives (From admission, onward)    Start     Dose/Rate Route Frequency Ordered Stop   07/31/23 1600  ceFAZolin  (ANCEF ) IVPB 2g/100 mL premix        2 g 200 mL/hr over 30 Minutes Intravenous Every 6 hours 07/31/23 1418 07/31/23 2243   07/31/23 0815  ceFAZolin  (ANCEF ) IVPB 2g/100 mL premix        2 g 200 mL/hr over 30 Minutes Intravenous On call to O.R. 07/31/23 9192 07/31/23 1016      and started on DVT prophylaxis in the form of Xarelto .   PT and OT were ordered for total joint protocol. Discharge planning consulted to help with postop disposition and equipment needs.  Patient had a fair night on the evening of surgery. They started to get up OOB with therapy on POD #0. Pt was seen during rounds and was ready to go home pending progress with therapy. He worked with therapy on POD #1 and was meeting his goals. Pt was discharged to home later that day in stable condition.  Diet: Regular diet Activity: WBAT Follow-up: in 2 weeks Disposition: Home Discharged Condition: stable   Discharge Instructions     Call MD / Call 911   Complete by: As directed    If you experience chest pain or shortness of breath, CALL 911 and be transported to the hospital emergency room.  If you develope a fever above 101 F, pus (white drainage) or increased drainage or redness at the wound, or calf pain, call your surgeon's office.   Change dressing   Complete by: As directed    You may remove the  bulky bandage (ACE wrap and gauze) two days after surgery. You will have an adhesive waterproof bandage underneath. Leave this in place until your first follow-up appointment.   Constipation Prevention   Complete by: As directed    Drink plenty of fluids.  Prune juice may be helpful.  You may use a stool softener, such as Colace (over the counter) 100 mg twice a day.  Use MiraLax  (over the counter) for constipation as needed.   Diet - low sodium heart healthy   Complete by: As directed    Do not put a pillow under the knee. Place it under the heel.   Complete by: As directed    Driving restrictions   Complete by: As directed    No driving for two weeks   Post-operative opioid taper instructions:   Complete by: As directed    POST-OPERATIVE OPIOID TAPER INSTRUCTIONS: It is important to wean off of your opioid medication as soon as possible. If you do not need pain medication after your surgery it is ok to stop day one. Opioids include: Codeine, Hydrocodone (Norco, Vicodin), Oxycodone (Percocet, oxycontin ) and hydromorphone  amongst others.  Long term and even short term use of opiods can cause: Increased pain response Dependence Constipation Depression Respiratory depression And more.  Withdrawal symptoms can include Flu like symptoms Nausea, vomiting And more Techniques to manage these symptoms Hydrate well Eat regular healthy meals Stay active Use relaxation techniques(deep breathing, meditating, yoga) Do Not substitute Alcohol  to help with tapering If you have been on opioids for less than two weeks  and do not have pain than it is ok to stop all together.  Plan to wean off of opioids This plan should start within one week post op of your joint replacement. Maintain the same interval or time between taking each dose and first decrease the dose.  Cut the total daily intake of opioids by one tablet each day Next start to increase the time between doses. The last dose that should  be eliminated is the evening dose.      TED hose   Complete by: As directed    Use stockings (TED hose) for three weeks on both leg(s).  You may remove them at night for sleeping.   Weight bearing as tolerated   Complete by: As directed       Allergies as of 08/01/2023       Reactions   Other Other (See Comments)   Blood Products- Willing to accept ONLY IF NECESSARY. He is not against receiving blood, in other words.        Medication List     STOP taking these medications    aspirin 325 MG tablet   ibuprofen  800 MG tablet Commonly known as: ADVIL        TAKE these medications    acetaminophen  650 MG CR tablet Commonly known as: TYLENOL  Take 1,300 mg by mouth at bedtime.   amphetamine -dextroamphetamine  20 MG tablet Commonly known as: ADDERALL Take 20 mg by mouth 3 (three) times daily.   atorvastatin  40 MG tablet Commonly known as: LIPITOR Take 1 tablet (40 mg total) by mouth daily.   benazepril  40 MG tablet Commonly known as: LOTENSIN  TAKE ONE TABLET BY MOUTH DAILY.   fluticasone  50 MCG/ACT nasal spray Commonly known as: FLONASE  2 sprays each nostril at bedtime What changed:  how much to take how to take this when to take this reasons to take this additional instructions   hydrochlorothiazide  12.5 MG capsule Commonly known as: MICROZIDE  TAKE ONE CAPSULE BY MOUTH DAILY   LUBRICATING EYE DROPS OP Place 1 drop into both eyes 3 (three) times daily as needed (for dryness).   methocarbamol  500 MG tablet Commonly known as: ROBAXIN  Take 1 tablet (500 mg total) by mouth every 6 (six) hours as needed for muscle spasms.   omeprazole 20 MG tablet Commonly known as: PRILOSEC OTC Take 20 mg by mouth daily as needed (for reflux or heartburn).   ondansetron  4 MG tablet Commonly known as: ZOFRAN  Take 1 tablet (4 mg total) by mouth every 6 (six) hours as needed for nausea.   oxyCODONE  5 MG immediate release tablet Commonly known as: Oxy  IR/ROXICODONE  Take 1-2 tablets (5-10 mg total) by mouth every 6 (six) hours as needed for severe pain (pain score 7-10).   THERAFLU FLU/COLD PO Take 1 packet by mouth daily as needed (cold symptoms).   traMADol  50 MG tablet Commonly known as: ULTRAM  Take 1-2 tablets (50-100 mg total) by mouth every 6 (six) hours as needed for moderate pain (pain score 4-6).   traZODone  150 MG tablet Commonly known as: DESYREL  Take 75 mg by mouth at bedtime as needed for sleep.   Xarelto  10 MG Tabs tablet Generic drug: rivaroxaban  Take 1 tablet (10 mg total) by mouth daily with breakfast for 20 days. Then resume a 325 mg aspirin once a day               Discharge Care Instructions  (From admission, onward)  Start     Ordered   08/01/23 0000  Weight bearing as tolerated        08/01/23 1227   08/01/23 0000  Change dressing       Comments: You may remove the bulky bandage (ACE wrap and gauze) two days after surgery. You will have an adhesive waterproof bandage underneath. Leave this in place until your first follow-up appointment.   08/01/23 1227            Follow-up Information     Melodi Lerner, MD Follow up in 2 week(s).   Specialty: Orthopedic Surgery Contact information: [address] [place] [address] [phone]                 Signed: R. Zelda Kobs, PA-C Orthopedic Surgery 08/02/2023, 12:23 PM
